# Patient Record
Sex: Female | Born: 1975
Health system: Southern US, Community
[De-identification: ages and names within clinical notes are randomized; demographics above are authoritative.]

## PROBLEM LIST (undated history)

## (undated) DIAGNOSIS — N870 Mild cervical dysplasia: Secondary | ICD-10-CM

## (undated) HISTORY — DX: Mild cervical dysplasia: N87.0

## (undated) HISTORY — PX: TYMPANOSTOMY TUBE PLACEMENT: SHX32

## (undated) HISTORY — PX: WISDOM TOOTH EXTRACTION: SHX21

## (undated) HISTORY — PX: ADENOIDECTOMY: SUR15

---

## 1998-11-09 ENCOUNTER — Other Ambulatory Visit: Admission: RE | Admit: 1998-11-09 | Discharge: 1998-11-09 | Payer: Self-pay | Admitting: *Deleted

## 2000-01-28 ENCOUNTER — Other Ambulatory Visit: Admission: RE | Admit: 2000-01-28 | Discharge: 2000-01-28 | Payer: Self-pay | Admitting: *Deleted

## 2001-03-20 ENCOUNTER — Other Ambulatory Visit: Admission: RE | Admit: 2001-03-20 | Discharge: 2001-03-20 | Payer: Self-pay | Admitting: Gynecology

## 2001-07-04 ENCOUNTER — Emergency Department (HOSPITAL_COMMUNITY): Admission: EM | Admit: 2001-07-04 | Discharge: 2001-07-04 | Payer: Self-pay

## 2002-06-17 ENCOUNTER — Other Ambulatory Visit: Admission: RE | Admit: 2002-06-17 | Discharge: 2002-06-17 | Payer: Self-pay | Admitting: Gynecology

## 2003-09-16 ENCOUNTER — Other Ambulatory Visit: Admission: RE | Admit: 2003-09-16 | Discharge: 2003-09-16 | Payer: Self-pay | Admitting: Gynecology

## 2004-09-14 ENCOUNTER — Other Ambulatory Visit: Admission: RE | Admit: 2004-09-14 | Discharge: 2004-09-14 | Payer: Self-pay | Admitting: Gynecology

## 2005-12-12 ENCOUNTER — Other Ambulatory Visit: Admission: RE | Admit: 2005-12-12 | Discharge: 2005-12-12 | Payer: Self-pay | Admitting: Gynecology

## 2006-05-11 ENCOUNTER — Emergency Department (HOSPITAL_COMMUNITY): Admission: EM | Admit: 2006-05-11 | Discharge: 2006-05-12 | Payer: Self-pay | Admitting: Emergency Medicine

## 2006-12-09 DIAGNOSIS — N870 Mild cervical dysplasia: Secondary | ICD-10-CM

## 2006-12-09 HISTORY — DX: Mild cervical dysplasia: N87.0

## 2007-04-09 HISTORY — PX: CRYOTHERAPY: SHX1416

## 2007-09-24 ENCOUNTER — Other Ambulatory Visit: Admission: RE | Admit: 2007-09-24 | Discharge: 2007-09-24 | Payer: Self-pay | Admitting: Gynecology

## 2008-03-17 ENCOUNTER — Other Ambulatory Visit: Admission: RE | Admit: 2008-03-17 | Discharge: 2008-03-17 | Payer: Self-pay | Admitting: Gynecology

## 2008-09-21 ENCOUNTER — Ambulatory Visit: Payer: Self-pay | Admitting: Women's Health

## 2008-09-21 ENCOUNTER — Encounter: Payer: Self-pay | Admitting: Women's Health

## 2008-09-21 ENCOUNTER — Other Ambulatory Visit: Admission: RE | Admit: 2008-09-21 | Discharge: 2008-09-21 | Payer: Self-pay | Admitting: Gynecology

## 2009-05-29 ENCOUNTER — Encounter: Payer: Self-pay | Admitting: Women's Health

## 2009-05-29 ENCOUNTER — Ambulatory Visit: Payer: Self-pay | Admitting: Women's Health

## 2009-05-29 ENCOUNTER — Other Ambulatory Visit: Admission: RE | Admit: 2009-05-29 | Discharge: 2009-05-29 | Payer: Self-pay | Admitting: Gynecology

## 2010-06-28 ENCOUNTER — Ambulatory Visit: Payer: Self-pay | Admitting: Women's Health

## 2010-06-28 ENCOUNTER — Other Ambulatory Visit: Admission: RE | Admit: 2010-06-28 | Discharge: 2010-06-28 | Payer: Self-pay | Admitting: Gynecology

## 2011-07-11 ENCOUNTER — Other Ambulatory Visit: Payer: Self-pay | Admitting: Women's Health

## 2011-07-17 ENCOUNTER — Ambulatory Visit (INDEPENDENT_AMBULATORY_CARE_PROVIDER_SITE_OTHER): Payer: 59 | Admitting: Women's Health

## 2011-07-17 ENCOUNTER — Encounter: Payer: Self-pay | Admitting: Women's Health

## 2011-07-17 ENCOUNTER — Other Ambulatory Visit (HOSPITAL_COMMUNITY)
Admission: RE | Admit: 2011-07-17 | Discharge: 2011-07-17 | Disposition: A | Discharge status: Home / Self Care | Payer: 59 | Source: Ambulatory Visit | Attending: Women's Health | Admitting: Women's Health

## 2011-07-17 VITALS — BP 120/70 | Ht 65.5 in | Wt 168.0 lb

## 2011-07-17 DIAGNOSIS — Z01419 Encounter for gynecological examination (general) (routine) without abnormal findings: Secondary | ICD-10-CM

## 2011-07-17 DIAGNOSIS — Z309 Encounter for contraceptive management, unspecified: Secondary | ICD-10-CM

## 2011-07-17 MED ORDER — NORGESTIMATE-ETH ESTRADIOL 0.25-35 MG-MCG PO TABS: 1.0000 | ORAL_TABLET | Freq: Every day | ORAL | Status: DC

## 2011-07-17 NOTE — Progress Notes (Signed)
Kristin Esparza 05-Aug-1976 [id number]    History:    The patient presents for annual exam.  35yo SWF G0 monthly 3 days light cycle on Ortho-Cyclen. She has not been sexually active in greater than a year. She is without complaints today. History of CIN-1 with a cryo- in 2008. Normal Paps after. 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   Past medical history, past surgical history, family history and social history were all reviewed and documented in the EPIC chart.   ROS:  A 14 point ROS was performed and pertinent positives and negatives are included in the history.  Exam:  Filed Vitals:   07/17/11 1437  BP: 120/70    General appearance:  Normal Head/Neck:  Normal, without cervical or supraclavicular adenopathy. Thyroid:  Symmetrical, normal in size, without palpable masses or nodularity. Respiratory  Effort:  Normal  Auscultation:  Clear without wheezing or rhonchi Cardiovascular  Auscultation:  Regular rate, without rubs, murmurs or gallops  Edema/varicosities:  Not grossly evident Abdominal  Masses/tenderness:  Soft,nontender, without masses, guarding or rebound.  Liver/spleen:  No organomegaly noted  Hernia:  None appreciated  Occult test:   Skin  Inspection:  Grossly normal  Palpation:  Grossly normal Neurologic/psychiatric  Orientation:  Normal with appropriate conversation.  Mood/affect:  Normal  Genitourinary    Breasts: Examined lying and sitting.     Right: Without masses, retractions, discharge or axillary adenopathy.     Left: Without masses, retractions, discharge or axillary adenopathy.   Inguinal/mons:  Normal without inguinal adenopathy  External genitalia:  Normal  BUS/Urethra/Skene's glands:  Normal  Bladder:  Normal  Vagina:  Normal  Cervix:  Normal  Uterus:   normal in size, shape and contour.  Midline and mobile  Adnexa/parametria:     Rt: Without masses or tenderness.   Lt: Without masses or tenderness.  Anus and perineum: Normal  Digital  rectal exam: Normal sphincter tone without palpated masses or tenderness  Assessment/Plan:  35 y.o. year old female for annual exam.   UA and Pap only today. She had normal labs at her health screening at [facility] health. She states all her labs were within normal limits. She does smoke about a half a pack a day and did review importance of no smoking or squinting for health and did review we could no longer use oral contraceptives if she is still smoking at 36. She will be 36 next year she is aware we can no longer prescribe after that. Prescription proper use of Ortho-Cyclen given, reviewed risks for blood clots strokes. Reviewed IUDs as an alternative contraception. Condoms encouraged if she becomes sexually active. SBE's, exercise, multivitamin daily encouraged.    Harrington Challenger MD, 3:28 PM 07/17/2011

## 2011-08-23 ENCOUNTER — Telehealth: Payer: Self-pay

## 2011-08-23 NOTE — Telephone Encounter (Signed)
Message was left on cell phone.

## 2011-08-23 NOTE — Telephone Encounter (Signed)
Pt called about a prescription for Chantix. She would like to talk to you. Do you want OV or would you call her?

## 2011-08-23 NOTE — Telephone Encounter (Signed)
Sherrilyn Rist I will call her.

## 2011-08-26 NOTE — Telephone Encounter (Signed)
Telephone call to patient left message instructed to call our office

## 2011-08-28 MED ORDER — VARENICLINE TARTRATE 0.5 MG PO TABS: 0.5000 mg | ORAL_TABLET | Freq: Two times a day (BID) | ORAL | Status: AC

## 2011-08-28 NOTE — Telephone Encounter (Signed)
RX CALLED TO JESSICA AT PHARMACY.

## 2011-08-28 NOTE — Telephone Encounter (Signed)
Telephone call to patient, states is ready to start chantix. Will call in Rx 2 [place] pharmacy. Did review chantix at her office visit is aware of, and side effects. Coupon mailed.  Sherri please call in Chantix starter pack  and continuing dose pack to [place] [facility] pharmacy.

## 2012-01-27 ENCOUNTER — Other Ambulatory Visit: Payer: Self-pay | Admitting: *Deleted

## 2012-01-27 ENCOUNTER — Emergency Department (INDEPENDENT_AMBULATORY_CARE_PROVIDER_SITE_OTHER)
Admission: EM | Admit: 2012-01-27 | Discharge: 2012-01-27 | Disposition: A | Discharge status: Home / Self Care | Payer: 59 | Source: Home / Self Care | Attending: Emergency Medicine | Admitting: Emergency Medicine

## 2012-01-27 ENCOUNTER — Encounter (HOSPITAL_COMMUNITY): Payer: Self-pay

## 2012-01-27 DIAGNOSIS — J020 Streptococcal pharyngitis: Secondary | ICD-10-CM

## 2012-01-27 MED ORDER — IBUPROFEN 600 MG PO TABS: 600.0000 mg | ORAL_TABLET | Freq: Four times a day (QID) | ORAL | Status: AC | PRN

## 2012-01-27 MED ORDER — VARENICLINE TARTRATE 1 MG PO TABS: 1.0000 mg | ORAL_TABLET | Freq: Two times a day (BID) | ORAL | Status: AC

## 2012-01-27 MED ORDER — CLINDAMYCIN HCL 150 MG PO CAPS: 450.0000 mg | ORAL_CAPSULE | Freq: Three times a day (TID) | ORAL | Status: AC

## 2012-01-27 NOTE — Discharge Instructions (Signed)
Strep Throat     Strep throat is an infection of the throat caused by a bacteria named Streptococcus pyogenes. Your caregiver may call the infection streptococcal "tonsillitis" or "pharyngitis" depending on whether there are signs of inflammation in the tonsils or back of the throat. Strep throat is most common in children from 5 to 35 years old during the cold months of the year, but it can occur in people of any age during any season. This infection is spread from person to person (contagious) through coughing, sneezing, or other close contact.  SYMPTOMS   · Fever or chills.   · Painful, swollen, red tonsils or throat.   · Pain or difficulty when swallowing.   · White or yellow spots on the tonsils or throat.   · Swollen, tender lymph nodes or "glands" of the neck or under the jaw.   · Red rash all over the body (rare).   DIAGNOSIS   Many different infections can cause the same symptoms. A test must be done to confirm the diagnosis so the right treatment can be given. A "rapid strep test" can help your caregiver make the diagnosis in a few minutes. If this test is not available, a light swab of the infected area can be used for a throat culture test. If a throat culture test is done, results are usually available in a day or two.  TREATMENT   Strep throat is treated with antibiotic medicine.  HOME CARE INSTRUCTIONS   · Gargle with 1 tsp of salt in 1 cup of warm water, 3 to 4 times per day or as needed for comfort.   · Family members who also have a sore throat or fever should be tested for strep throat and treated with antibiotics if they have the strep infection.   · Make sure everyone in your household washes their hands well.   · Do not share food, drinking cups, or personal items that could cause the infection to spread to others.   · You may need to eat a soft food diet until your sore throat gets better.   · Drink enough water and fluids to keep your urine clear or pale yellow. This will help prevent  dehydration.   · Get plenty of rest.   · Stay home from school, daycare, or work until you have been on antibiotics for 24 hours.   · Only take over-the-counter or prescription medicines for pain, discomfort, or fever as directed by your caregiver.   · If antibiotics are prescribed, take them as directed. Finish them even if you start to feel better.   SEEK MEDICAL CARE IF:   · The glands in your neck continue to enlarge.   · You develop a rash, cough, or earache.   · You cough up green, yellow-brown, or bloody sputum.   · You have pain or discomfort not controlled by medicines.   · Your problems seem to be getting worse rather than better.   SEEK IMMEDIATE MEDICAL CARE IF:   · You develop any new symptoms such as vomiting, severe headache, stiff or painful neck, chest pain, shortness of breath, or trouble swallowing.   · You develop severe throat pain, drooling, or changes in your voice.   · You develop swelling of the neck, or the skin on the neck becomes red and tender.   · You have a fever.   · You develop signs of dehydration, such as fatigue, dry mouth, and decreased urination.   · 

## 2012-01-27 NOTE — ED Notes (Signed)
States  She woke this AM w left sided throat pain, now both sides; left tonsil swollen> right side , both slightly reddened, no exudate noted; NAD; c/o generaized body pain, no known exposures

## 2012-01-27 NOTE — ED Provider Notes (Signed)
History     CSN: [id number]  Arrival date & time 01/27/12  1155   First MD Initiated Contact with Patient 01/27/12 1326      Chief Complaint  Patient presents with  . Sore Throat    (Consider location/radiation/quality/duration/timing/severity/associated sxs/prior treatment) HPI Comments: Patient reports left-sided sore throat starting this morning. Reports some malaise, generalized body aches as well. Some rhinorrhea. No nausea, vomiting, ear pain, change in hearing, trismus, drooling, voice changes, reflux symptoms. No headache, coughing, wheezing, abdominal pain, rash. No known sick contacts, but patient is a tach in the OR.. States this feels identical to previous episode of strep throat. Has been taking throat lozenges without relief.  ROS as noted in HPI. All other ROS negative.   Patient is a 36 y.o. female presenting with pharyngitis. The history is provided by the patient. No language interpreter was used.  Sore Throat This is a new problem. The current episode started 6 to 12 hours ago. The problem occurs constantly. The problem has been gradually worsening. Pertinent negatives include no chest pain, no abdominal pain, no headaches and no shortness of breath. The symptoms are aggravated by swallowing. The symptoms are relieved by nothing. She has tried nothing for the symptoms. The treatment provided no relief.    Past Medical History  Diagnosis Date  . CIN I (cervical intraepithelial neoplasia I) 2008    Past Surgical History  Procedure Date  . Tympanostomy tube placement     3 times  . Adenoidectomy   . Cryotherapy 04/2007    Family History  Problem Relation Age of Onset  . Hypertension Mother   . Diabetes Mother   . Ovarian cancer Maternal Aunt   . Cancer Maternal Grandfather     lung cancer    History  Substance Use Topics  . Smoking status: Current Everyday Smoker -- 0.3 packs/day for 10 years    Types: Cigarettes  . Smokeless tobacco: Never Used  .  Alcohol Use: No    OB History    Grav Para Term Preterm Abortions TAB SAB Ect Mult Living   0               Review of Systems  Respiratory: Negative for shortness of breath.   Cardiovascular: Negative for chest pain.  Gastrointestinal: Negative for abdominal pain.  Neurological: Negative for headaches.    Allergies  Penicillins  Home Medications   Current Outpatient Rx  Name Route Sig Dispense Refill  . VARENICLINE TARTRATE 0.5 MG PO TABS Oral Take 0.5 mg by mouth 2 (two) times daily.    Marland Kitchen CLINDAMYCIN HCL 150 MG PO CAPS Oral Take 3 capsules (450 mg total) by mouth 3 (three) times daily. X 10 days 90 capsule 0  . IBUPROFEN 600 MG PO TABS Oral Take 1 tablet (600 mg total) by mouth every 6 (six) hours as needed for pain. 30 tablet 0  . NORGESTIMATE-ETH ESTRADIOL 0.25-35 MG-MCG PO TABS Oral Take 1 tablet by mouth daily. 28 tablet 10    BP 143/89  Pulse 90  Temp(Src) 99.3 F (37.4 C) (Oral)  Resp 16  SpO2 100%  LMP 01/23/2012  Physical Exam  Nursing note and vitals reviewed. Constitutional: She is oriented to person, place, and time. She appears well-developed and well-nourished.  HENT:  Head: Normocephalic and atraumatic.  Right Ear: Tympanic membrane and ear canal normal.  Left Ear: Tympanic membrane and ear canal normal.  Nose: Nose normal.  Mouth/Throat: Uvula is midline and mucous membranes are  normal. Posterior oropharyngeal erythema present. No oropharyngeal exudate.       (-) frontal, maxillary sinus tenderness. Bilateral tonsillar swelling, erythema. left more than right. Scant exudate.  Eyes: Conjunctivae and EOM are normal.  Neck: Normal range of motion. Neck supple.  Cardiovascular: Normal rate, regular rhythm and normal heart sounds.   Pulmonary/Chest: Effort normal and breath sounds normal.  Abdominal: She exhibits no distension.  Musculoskeletal: Normal range of motion.  Lymphadenopathy:    She has cervical adenopathy.  Neurological: She is alert and  oriented to person, place, and time.  Skin: Skin is warm and dry. No rash noted.  Psychiatric: She has a normal mood and affect. Her behavior is normal. Judgment and thought content normal.    ED Course  Procedures (including critical care time)  Labs Reviewed  POCT RAPID STREP A ([facility] CARE ONLY) - Abnormal; Notable for the following:    Streptococcus, Group A Screen (Direct) POSITIVE (*)    All other components within normal limits   No results found.   1. Strep pharyngitis       MDM    Discussed results with patient. Sending him with clindamycin, NSAIDs.   Luiz Blare, MD 01/27/12 1440

## 2012-08-06 ENCOUNTER — Other Ambulatory Visit: Payer: Self-pay | Admitting: Women's Health

## 2012-09-24 ENCOUNTER — Ambulatory Visit (INDEPENDENT_AMBULATORY_CARE_PROVIDER_SITE_OTHER): Payer: 59 | Admitting: Women's Health

## 2012-09-24 ENCOUNTER — Encounter: Payer: Self-pay | Admitting: Women's Health

## 2012-09-24 VITALS — BP 130/82 | Ht 65.5 in | Wt 167.0 lb

## 2012-09-24 DIAGNOSIS — Z87891 Personal history of nicotine dependence: Secondary | ICD-10-CM

## 2012-09-24 DIAGNOSIS — IMO0001 Reserved for inherently not codable concepts without codable children: Secondary | ICD-10-CM

## 2012-09-24 DIAGNOSIS — Z01419 Encounter for gynecological examination (general) (routine) without abnormal findings: Secondary | ICD-10-CM

## 2012-09-24 DIAGNOSIS — Z309 Encounter for contraceptive management, unspecified: Secondary | ICD-10-CM

## 2012-09-24 DIAGNOSIS — N870 Mild cervical dysplasia: Secondary | ICD-10-CM

## 2012-09-24 LAB — CBC WITH DIFFERENTIAL/PLATELET
Basophils Absolute: 0 10*3/uL (ref 0.0–0.1)
Basophils Relative: 0 % (ref 0–1)
Eosinophils Relative: 3 % (ref 0–5)
HCT: 40.2 % (ref 36.0–46.0)
MCHC: 33.6 g/dL (ref 30.0–36.0)
MCV: 90.5 fL (ref 78.0–100.0)
Monocytes Absolute: 0.4 10*3/uL (ref 0.1–1.0)
Monocytes Relative: 5 % (ref 3–12)
RDW: 12.7 % (ref 11.5–15.5)

## 2012-09-24 MED ORDER — VARENICLINE TARTRATE 0.5 MG PO TABS
0.5000 mg | ORAL_TABLET | Freq: Two times a day (BID) | ORAL | Status: DC
Start: 2012-09-24 — End: 2013-12-06

## 2012-09-24 MED ORDER — NORGESTIMATE-ETH ESTRADIOL 0.25-35 MG-MCG PO TABS: 1.0000 | ORAL_TABLET | Freq: Every day | ORAL | Status: DC

## 2012-09-24 NOTE — Patient Instructions (Addendum)

## 2012-09-24 NOTE — Progress Notes (Signed)
Kristin Esparza January 31, 1976 [id number]    History:    The patient presents for annual exam.  Monthly 4 days cycle on Sprintec without complaint. Not sexually active. History of CIN-1 with cryo- in 2008 with normal Paps after. Quit smoking 3 months ago currently on Chantix daily. Has had normal fasting blood sugars at home with mother's meter.   Past medical history, past surgical history, family history and social history were all reviewed and documented in the EPIC chart. 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 Maternal aunt ovarian cancer at age 75.   ROS:  A  ROS was performed and pertinent positives and negatives are included in the history.  Exam:  Filed Vitals:   09/24/12 1419  BP: 130/82    General appearance:  Normal Head/Neck:  Normal, without cervical or supraclavicular adenopathy. Thyroid:  Symmetrical, normal in size, without palpable masses or nodularity. Respiratory  Effort:  Normal  Auscultation:  Clear without wheezing or rhonchi Cardiovascular  Auscultation:  Regular rate, without rubs, murmurs or gallops  Edema/varicosities:  Not grossly evident Abdominal  Soft,nontender, without masses, guarding or rebound.  Liver/spleen:  No organomegaly noted  Hernia:  None appreciated  Skin  Inspection:  Grossly normal  Palpation:  Grossly normal Neurologic/psychiatric  Orientation:  Normal with appropriate conversation.  Mood/affect:  Normal  Genitourinary    Breasts: Examined lying and sitting.     Right: Without masses, retractions, discharge or axillary adenopathy.     Left: Without masses, retractions, discharge or axillary adenopathy.   Inguinal/mons:  Normal without inguinal adenopathy  External genitalia:  Normal  BUS/Urethra/Skene's glands:  Normal  Bladder:  Normal  Vagina:  Normal  Cervix:  Normal  Uterus:   normal in size, shape and contour.  Midline and mobile  Adnexa/parametria:     Rt: Without masses or  tenderness.   Lt: Without masses or tenderness.  Anus and perineum: Normal  Digital rectal exam: Normal sphincter tone without palpated masses or tenderness  Assessment/Plan:  36 y.o. SWF G0  for annual exam with no complaints.  Normal GYN exam on Sprintec Former smoker  Plan: Congratulated on smoking cessation, will continue on Chantix 50 daily prescription, proper use given and reviewed for one more month to help prevent relapse. Reviewed marked increased risk for blood clots and strokes if smoking and on birth control pills after 36. Sprintec prescription, proper use given and reviewed, condoms encouraged if become sexually active. SBE's,  calcium rich diet, decreasing calories and increasing exercise for weight loss and MVI daily encouraged. CBC, UA, no Pap, history of normal Pap in 2012. New screening guidelines reviewed.    Harrington Challenger [facility], 3:27 PM 09/24/2012

## 2012-09-25 LAB — URINALYSIS W MICROSCOPIC + REFLEX CULTURE
Bilirubin Urine: NEGATIVE
Crystals: NONE SEEN
Leukocytes, UA: NEGATIVE
Nitrite: NEGATIVE
Protein, ur: NEGATIVE mg/dL
Specific Gravity, Urine: 1.02 (ref 1.005–1.030)
Squamous Epithelial / LPF: NONE SEEN
Urobilinogen, UA: 0.2 mg/dL (ref 0.0–1.0)

## 2012-12-21 ENCOUNTER — Other Ambulatory Visit (HOSPITAL_COMMUNITY): Payer: Self-pay | Admitting: Family Medicine

## 2012-12-21 DIAGNOSIS — J329 Chronic sinusitis, unspecified: Secondary | ICD-10-CM

## 2012-12-28 ENCOUNTER — Ambulatory Visit (HOSPITAL_COMMUNITY)
Admission: RE | Admit: 2012-12-28 | Discharge: 2012-12-28 | Disposition: A | Discharge status: Home / Self Care | Payer: 59 | Source: Ambulatory Visit | Attending: Family Medicine | Admitting: Family Medicine

## 2012-12-28 DIAGNOSIS — J329 Chronic sinusitis, unspecified: Secondary | ICD-10-CM

## 2013-09-24 ENCOUNTER — Emergency Department (HOSPITAL_COMMUNITY)
Admission: EM | Admit: 2013-09-24 | Discharge: 2013-09-24 | Disposition: A | Discharge status: Home / Self Care | Payer: 59 | Source: Home / Self Care | Attending: Family Medicine | Admitting: Family Medicine

## 2013-09-24 ENCOUNTER — Encounter (HOSPITAL_COMMUNITY): Payer: Self-pay | Admitting: Emergency Medicine

## 2013-09-24 DIAGNOSIS — J019 Acute sinusitis, unspecified: Secondary | ICD-10-CM

## 2013-09-24 DIAGNOSIS — J069 Acute upper respiratory infection, unspecified: Secondary | ICD-10-CM

## 2013-09-24 MED ORDER — AZITHROMYCIN 250 MG PO TABS: ORAL_TABLET | ORAL | Status: DC

## 2013-09-24 MED ORDER — HYDROCOD POLST-CHLORPHEN POLST 10-8 MG/5ML PO LQCR: 5.0000 mL | Freq: Every evening | ORAL | Status: DC | PRN

## 2013-09-24 NOTE — ED Provider Notes (Signed)
CSN: [id number]     Arrival date & time 09/24/13  1027 History   First MD Initiated Contact with Patient 09/24/13 1048     Chief Complaint  Patient presents with  . URI    Patient is a 37 y.o. female presenting with URI. The history is provided by the patient.  URI Presenting symptoms: congestion, cough, ear pain, facial pain, fatigue, fever, rhinorrhea and sore throat   Severity:  Moderate Onset quality:  Gradual Duration:  3 days Timing:  Constant Progression:  Worsening Chronicity:  New Relieved by:  Nothing Ineffective treatments:  Decongestant and OTC medications Associated symptoms: sinus pain and sneezing   Associated symptoms: no swollen glands and no wheezing   Risk factors: no recent illness, no recent travel and no sick contacts   Pt reports onset of sinus congestion on Wednesday after several days of worsening seasonal allergy type symptoms that were not relieved by OTC anti-allergy med. Symptoms have worsened and now include bil ear pressure, throat "irritation", congested cough and bil facial pressure. Cough is worse at night. Now pt reports worsening malaise and subjective fever.  Past Medical History  Diagnosis Date  . CIN I (cervical intraepithelial neoplasia I) 2008   Past Surgical History  Procedure Laterality Date  . Tympanostomy tube placement      3 times  . Adenoidectomy    . Cryotherapy  04/2007   Family History  Problem Relation Age of Onset  . Hypertension Mother   . Diabetes Mother   . Ovarian cancer Maternal Aunt   . Cancer Maternal Grandfather     lung cancer   History  Substance Use Topics  . Smoking status: Former Smoker -- 0.30 packs/day for 10 years    Types: Cigarettes  . Smokeless tobacco: Never Used  . Alcohol Use: No   OB History   Grav Para Term Preterm Abortions TAB SAB Ect Mult Living   0              Review of Systems  Constitutional: Positive for fever, chills and fatigue.  HENT: Positive for congestion, ear pain,  rhinorrhea, sinus pressure, sneezing and sore throat. Negative for facial swelling and trouble swallowing.   Respiratory: Positive for cough. Negative for wheezing.   Cardiovascular: Negative.   Gastrointestinal: Negative.   Endocrine: Negative.   Genitourinary: Negative.   Musculoskeletal: Negative.   Allergic/Immunologic: Negative.   Neurological: Negative.   Hematological: Negative.   Psychiatric/Behavioral: Negative.     Allergies  Penicillins  Home Medications   Current Outpatient Rx  Name  Route  Sig  Dispense  Refill  . azithromycin (ZITHROMAX Z-PAK) 250 MG tablet      Take 2 tabs on day 1 then 1 tab on days 2-5   6 tablet   0   . chlorpheniramine-HYDROcodone (TUSSIONEX PENNKINETIC ER) 10-8 MG/5ML LQCR   Oral   Take 5 mLs by mouth at bedtime as needed.   30 mL   0   . norgestimate-ethinyl estradiol (MONONESSA) 0.25-35 MG-MCG tablet   Oral   Take 1 tablet by mouth daily.   84 tablet   4   . varenicline (CHANTIX) 0.5 MG tablet   Oral   Take 1 tablet (0.5 mg total) by mouth 2 (two) times daily.   60 tablet   2    BP 143/88  Pulse 101  Temp(Src) 99.4 F (37.4 C) (Oral)  SpO2 98%  LMP 09/23/2013 Physical Exam  Constitutional: She appears well-developed and well-nourished. She  has a sickly appearance.  HENT:  Head: Normocephalic and atraumatic.  Right Ear: Tympanic membrane, external ear and ear canal normal.  Left Ear: Tympanic membrane, external ear and ear canal normal.  Nose: Mucosal edema present. Right sinus exhibits maxillary sinus tenderness. Right sinus exhibits no frontal sinus tenderness. Left sinus exhibits maxillary sinus tenderness. Left sinus exhibits no frontal sinus tenderness.  Mouth/Throat: Uvula is midline, oropharynx is clear and moist and mucous membranes are normal.  Neck: Neck supple.  Cardiovascular: Normal rate and regular rhythm.   Pulmonary/Chest: Effort normal and breath sounds normal.  Musculoskeletal: Normal range of  motion.  Lymphadenopathy:    She has no cervical adenopathy.  Neurological: She is alert.  Skin: Skin is warm and dry.  Psychiatric: She has a normal mood and affect.    ED Course  Procedures (including critical care time) Labs Review Labs Reviewed - No data to display Imaging Review No results found.  EKG Interpretation     Ventricular Rate:    PR Interval:    QRS Duration:   QT Interval:    QTC Calculation:   R Axis:     Text Interpretation:              MDM   1. Acute sinusitis   2. URI (upper respiratory infection)    3 day h/o sinus congestion and pressure that has worsened and now involves bil ear pressure and congested cough w/ subjective fever. PE c/w acute sinusititis. Will treat w/ Z-Pack as allergic to PCN. Will also provide short course of med for cough at night. Pt to f/u w/ PCP if not improving. Pt agreeable w/ plan.    Leanne Chang, NP 09/24/13 [phone]

## 2013-09-24 NOTE — ED Notes (Signed)
Pt  Reports    Symptoms  Of  Sinus  Congestion  /  Pressure       Stuffy  Nose  And  Has  Some  Productive  Cough     Pt  Reports  Beginning  To  Have  Some  Pressure  And  Itching in  Ears   She  Is  Sitting  Upright on exam table  Speaking in  Complete  sentances  And  Is  In no acute  distress

## 2013-09-25 NOTE — ED Provider Notes (Signed)
Medical screening examination/treatment/procedure(s) were performed by a resident physician or non-physician practitioner and as the supervising physician I was immediately available for consultation/collaboration.  Clementeen Graham, MD  Rodolph Bong, MD 09/25/13 [phone]

## 2013-12-06 ENCOUNTER — Other Ambulatory Visit: Payer: Self-pay | Admitting: Women's Health

## 2013-12-20 ENCOUNTER — Other Ambulatory Visit: Payer: Self-pay | Admitting: Women's Health

## 2013-12-24 ENCOUNTER — Encounter: Payer: Self-pay | Admitting: Women's Health

## 2014-01-07 ENCOUNTER — Other Ambulatory Visit (HOSPITAL_COMMUNITY)
Admission: RE | Admit: 2014-01-07 | Discharge: 2014-01-07 | Disposition: A | Discharge status: Home / Self Care | Payer: 59 | Source: Ambulatory Visit | Attending: Gynecology | Admitting: Gynecology

## 2014-01-07 ENCOUNTER — Encounter: Payer: Self-pay | Admitting: Women's Health

## 2014-01-07 ENCOUNTER — Ambulatory Visit (INDEPENDENT_AMBULATORY_CARE_PROVIDER_SITE_OTHER): Payer: 59 | Admitting: Women's Health

## 2014-01-07 VITALS — BP 116/72 | Ht 65.5 in | Wt 162.4 lb

## 2014-01-07 DIAGNOSIS — Z01419 Encounter for gynecological examination (general) (routine) without abnormal findings: Secondary | ICD-10-CM

## 2014-01-07 DIAGNOSIS — IMO0001 Reserved for inherently not codable concepts without codable children: Secondary | ICD-10-CM

## 2014-01-07 DIAGNOSIS — Z833 Family history of diabetes mellitus: Secondary | ICD-10-CM

## 2014-01-07 DIAGNOSIS — Z309 Encounter for contraceptive management, unspecified: Secondary | ICD-10-CM

## 2014-01-07 DIAGNOSIS — Z13220 Encounter for screening for lipoid disorders: Secondary | ICD-10-CM

## 2014-01-07 LAB — CBC WITH DIFFERENTIAL/PLATELET
Basophils Absolute: 0 K/uL (ref 0.0–0.1)
Basophils Relative: 0 % (ref 0–1)
Eosinophils Absolute: 0.2 K/uL (ref 0.0–0.7)
Eosinophils Relative: 3 % (ref 0–5)
HCT: 40.7 % (ref 36.0–46.0)
Hemoglobin: 13.6 g/dL (ref 12.0–15.0)
Lymphocytes Relative: 30 % (ref 12–46)
Lymphs Abs: 1.9 K/uL (ref 0.7–4.0)
MCH: 30.1 pg (ref 26.0–34.0)
MCHC: 33.4 g/dL (ref 30.0–36.0)
MCV: 90 fL (ref 78.0–100.0)
Monocytes Absolute: 0.4 K/uL (ref 0.1–1.0)
Monocytes Relative: 7 % (ref 3–12)
Neutro Abs: 3.7 K/uL (ref 1.7–7.7)
Neutrophils Relative %: 60 % (ref 43–77)
Platelets: 244 K/uL (ref 150–400)
RBC: 4.52 MIL/uL (ref 3.87–5.11)
RDW: 13.1 % (ref 11.5–15.5)
WBC: 6.2 K/uL (ref 4.0–10.5)

## 2014-01-07 MED ORDER — NORGESTIMATE-ETH ESTRADIOL 0.25-35 MG-MCG PO TABS: ORAL_TABLET | ORAL | Status: DC

## 2014-01-07 NOTE — Patient Instructions (Signed)

## 2014-01-07 NOTE — Addendum Note (Signed)
Addended by: WILKINSON, KARI S on: 01/07/2014 05:02 PM   Modules accepted: Orders  

## 2014-01-07 NOTE — Progress Notes (Signed)
Kristin RutherfordJaime L Esparza 10/25/1976 [id number]    History:    Presents for annual exam.  Light monthly cycle on Sprintec without complaint. Not sexually active. 2008 CIN-1 cryo with normal Paps after.   Past medical history, past surgical history, family history and social history were all reviewed and documented in the EPIC chart. 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 Maternal aunt ovarian cancer.  ROS:  A  ROS was performed and pertinent positives and negatives are included.  Exam:  Filed Vitals:   01/07/14 1440  BP: 116/72    General appearance:  Normal Thyroid:  Symmetrical, normal in size, without palpable masses or nodularity. Respiratory  Auscultation:  Clear without wheezing or rhonchi Cardiovascular  Auscultation:  Regular rate, without rubs, murmurs or gallops  Edema/varicosities:  Not grossly evident Abdominal  Soft,nontender, without masses, guarding or rebound.  Liver/spleen:  No organomegaly noted  Hernia:  None appreciated  Skin  Inspection:  Grossly normal   Breasts: Examined lying and sitting.     Right: Without masses, retractions, discharge or axillary adenopathy.     Left: Without masses, retractions, discharge or axillary adenopathy. Gentitourinary   Inguinal/mons:  Normal without inguinal adenopathy  External genitalia:  Normal  BUS/Urethra/Skene's glands:  Normal  Vagina:  Normal  Cervix:  Normal  Uterus:   normal in size, shape and contour.  Midline and mobile  Adnexa/parametria:     Rt: Without masses or tenderness.   Lt: Without masses or tenderness.  Anus and perineum: Normal  Digital rectal exam: Normal sphincter tone without palpated masses or tenderness  Assessment/Plan:  38 y.o. SWF G0 for annual exam with no complaints.  2008 CIN-1/cryo- normal Paps after  Plan: Sprintec prescription, proper use, risk for blood clots and strokes reviewed, condoms encouraged if sexually active. SBE's, increase regular  exercise and decrease calories for weight loss, calcium rich diet, MVI daily encouraged. Not planning on children at this time. CBC, glucose, lipid panel, UA, Pap. Pap normal 07/2011.    Harrington ChallengerYOUNG,Panayiotis Rainville J Sar[facility] Phyiscians Surgical CenterWHNP, 4:50 PM 01/07/2014

## 2014-01-08 LAB — LIPID PANEL
CHOL/HDL RATIO: 2.8 ratio
CHOLESTEROL: 182 mg/dL (ref 0–200)
HDL: 66 mg/dL (ref >39)
LDL Cholesterol: 84 mg/dL (ref 0–99)
Triglycerides: 161 mg/dL — ABNORMAL HIGH (ref <150)
VLDL: 32 mg/dL (ref 0–40)

## 2014-01-08 LAB — URINALYSIS W MICROSCOPIC + REFLEX CULTURE
BILIRUBIN URINE: NEGATIVE
Casts: NONE SEEN
Crystals: NONE SEEN
Glucose, UA: NEGATIVE mg/dL
Hgb urine dipstick: NEGATIVE
KETONES UR: NEGATIVE mg/dL
Leukocytes, UA: NEGATIVE
Nitrite: NEGATIVE
Protein, ur: NEGATIVE mg/dL
Specific Gravity, Urine: 1.009 (ref 1.005–1.030)
Urobilinogen, UA: 0.2 mg/dL (ref 0.0–1.0)
pH: 6.5 (ref 5.0–8.0)

## 2014-01-08 LAB — GLUCOSE, RANDOM: Glucose, Bld: 89 mg/dL (ref 70–99)

## 2014-01-09 LAB — URINE CULTURE

## 2015-02-13 ENCOUNTER — Other Ambulatory Visit: Payer: Self-pay | Admitting: Women's Health

## 2015-02-13 NOTE — Telephone Encounter (Signed)
Lat CE 01/07/2014. She has CE scheduled 02/23/15 with you.

## 2015-02-23 ENCOUNTER — Encounter: Payer: 59 | Admitting: Women's Health

## 2015-03-23 ENCOUNTER — Encounter: Payer: Self-pay | Admitting: Women's Health

## 2015-03-23 ENCOUNTER — Other Ambulatory Visit (HOSPITAL_COMMUNITY)
Admission: RE | Admit: 2015-03-23 | Discharge: 2015-03-23 | Disposition: A | Discharge status: Home / Self Care | Payer: 59 | Source: Ambulatory Visit | Attending: Women's Health | Admitting: Women's Health

## 2015-03-23 ENCOUNTER — Ambulatory Visit (INDEPENDENT_AMBULATORY_CARE_PROVIDER_SITE_OTHER): Payer: 59 | Admitting: Women's Health

## 2015-03-23 VITALS — BP 117/72 | Ht 65.0 in | Wt 155.8 lb

## 2015-03-23 DIAGNOSIS — J0100 Acute maxillary sinusitis, unspecified: Secondary | ICD-10-CM | POA: Diagnosis not present

## 2015-03-23 DIAGNOSIS — Z833 Family history of diabetes mellitus: Secondary | ICD-10-CM | POA: Diagnosis not present

## 2015-03-23 DIAGNOSIS — Z3041 Encounter for surveillance of contraceptive pills: Secondary | ICD-10-CM

## 2015-03-23 DIAGNOSIS — Z01419 Encounter for gynecological examination (general) (routine) without abnormal findings: Secondary | ICD-10-CM | POA: Insufficient documentation

## 2015-03-23 DIAGNOSIS — B373 Candidiasis of vulva and vagina: Secondary | ICD-10-CM

## 2015-03-23 DIAGNOSIS — Z13220 Encounter for screening for lipoid disorders: Secondary | ICD-10-CM

## 2015-03-23 DIAGNOSIS — Z1151 Encounter for screening for human papillomavirus (HPV): Secondary | ICD-10-CM | POA: Insufficient documentation

## 2015-03-23 DIAGNOSIS — B3731 Acute candidiasis of vulva and vagina: Secondary | ICD-10-CM

## 2015-03-23 LAB — LIPID PANEL
Cholesterol: 197 mg/dL (ref 0–200)
HDL: 67 mg/dL (ref >=46)
LDL Cholesterol: 104 mg/dL — ABNORMAL HIGH (ref 0–99)
Total CHOL/HDL Ratio: 2.9 Ratio
Triglycerides: 130 mg/dL (ref <150)
VLDL: 26 mg/dL (ref 0–40)

## 2015-03-23 LAB — GLUCOSE, RANDOM: Glucose, Bld: 91 mg/dL (ref 70–99)

## 2015-03-23 MED ORDER — CLINDAMYCIN HCL 150 MG PO CAPS: 150.0000 mg | ORAL_CAPSULE | Freq: Three times a day (TID) | ORAL | Status: DC

## 2015-03-23 MED ORDER — NORGESTIMATE-ETH ESTRADIOL 0.25-35 MG-MCG PO TABS: 1.0000 | ORAL_TABLET | Freq: Every day | ORAL | Status: DC

## 2015-03-23 MED ORDER — FLUCONAZOLE 150 MG PO TABS: 150.0000 mg | ORAL_TABLET | Freq: Once | ORAL | Status: DC

## 2015-03-23 NOTE — Patient Instructions (Signed)
Health Maintenance Adopting a healthy lifestyle and getting preventive care can go a long way to promote health and wellness. Talk with your health care provider about what schedule of regular examinations is right for you. This is a good chance for you to check in with your provider about disease prevention and staying healthy. In between checkups, there are plenty of things you can do on your own. Experts have done a lot of research about which lifestyle changes and preventive measures are most likely to keep you healthy. Ask your health care provider for more information. WEIGHT AND DIET  Eat a healthy diet  Be sure to include plenty of vegetables, fruits, low-fat dairy products, and lean protein.  Do not eat a lot of foods high in solid fats, added sugars, or salt.  Get regular exercise. This is one of the most important things you can do for your health.  Most adults should exercise for at least 150 minutes each week. The exercise should increase your heart rate and make you sweat (moderate-intensity exercise).  Most adults should also do strengthening exercises at least twice a week. This is in addition to the moderate-intensity exercise.  Maintain a healthy weight  Body mass index (BMI) is a measurement that can be used to identify possible weight problems. It estimates body fat based on height and weight. Your health care provider can help determine your BMI and help you achieve or maintain a healthy weight.  For females 25 years of age and older:   A BMI below 18.5 is considered underweight.  A BMI of 18.5 to 24.9 is normal.  A BMI of 25 to 29.9 is considered overweight.  A BMI of 30 and above is considered obese.  Watch levels of cholesterol and blood lipids  You should start having your blood tested for lipids and cholesterol at 38 years of age, then have this test every 5 years.  You may need to have your cholesterol levels checked more often if:  Your lipid or  cholesterol levels are high.  You are older than 38 years of age.  You are at high risk for heart disease.  CANCER SCREENING   Lung Cancer  Lung cancer screening is recommended for adults 97-92 years old who are at high risk for lung cancer because of a history of smoking.  A yearly low-dose CT scan of the lungs is recommended for people who:  Currently smoke.  Have quit within the past 15 years.  Have at least a 30-pack-year history of smoking. A pack year is smoking an average of one pack of cigarettes a day for 1 year.  Yearly screening should continue until it has been 15 years since you quit.  Yearly screening should stop if you develop a health problem that would prevent you from having lung cancer treatment.  Breast Cancer  Practice breast self-awareness. This means understanding how your breasts normally appear and feel.  It also means doing regular breast self-exams. Let your health care provider know about any changes, no matter how small.  If you are in your 20s or 30s, you should have a clinical breast exam (CBE) by a health care provider every 1-3 years as part of a regular health exam.  If you are 76 or older, have a CBE every year. Also consider having a breast X-ray (mammogram) every year.  If you have a family history of breast cancer, talk to your health care provider about genetic screening.  If you are  at high risk for breast cancer, talk to your health care provider about having an MRI and a mammogram every year.  Breast cancer gene (BRCA) assessment is recommended for women who have family members with BRCA-related cancers. BRCA-related cancers include:  Breast.  Ovarian.  Tubal.  Peritoneal cancers.  Results of the assessment will determine the need for genetic counseling and BRCA1 and BRCA2 testing. Cervical Cancer Routine pelvic examinations to screen for cervical cancer are no longer recommended for nonpregnant women who are considered low  risk for cancer of the pelvic organs (ovaries, uterus, and vagina) and who do not have symptoms. A pelvic examination may be necessary if you have symptoms including those associated with pelvic infections. Ask your health care provider if a screening pelvic exam is right for you.   The Pap test is the screening test for cervical cancer for women who are considered at risk.  If you had a hysterectomy for a problem that was not cancer or a condition that could lead to cancer, then you no longer need Pap tests.  If you are older than 65 years, and you have had normal Pap tests for the past 10 years, you no longer need to have Pap tests.  If you have had past treatment for cervical cancer or a condition that could lead to cancer, you need Pap tests and screening for cancer for at least 20 years after your treatment.  If you no longer get a Pap test, assess your risk factors if they change (such as having a new sexual partner). This can affect whether you should start being screened again.  Some women have medical problems that increase their chance of getting cervical cancer. If this is the case for you, your health care provider may recommend more frequent screening and Pap tests.  The human papillomavirus (HPV) test is another test that may be used for cervical cancer screening. The HPV test looks for the virus that can cause cell changes in the cervix. The cells collected during the Pap test can be tested for HPV.  The HPV test can be used to screen women 30 years of age and older. Getting tested for HPV can extend the interval between normal Pap tests from three to five years.  An HPV test also should be used to screen women of any age who have unclear Pap test results.  After 38 years of age, women should have HPV testing as often as Pap tests.  Colorectal Cancer  This type of cancer can be detected and often prevented.  Routine colorectal cancer screening usually begins at 38 years of  age and continues through 38 years of age.  Your health care provider may recommend screening at an earlier age if you have risk factors for colon cancer.  Your health care provider may also recommend using home test kits to check for hidden blood in the stool.  A small camera at the end of a tube can be used to examine your colon directly (sigmoidoscopy or colonoscopy). This is done to check for the earliest forms of colorectal cancer.  Routine screening usually begins at age 50.  Direct examination of the colon should be repeated every 5-10 years through 38 years of age. However, you may need to be screened more often if early forms of precancerous polyps or small growths are found. Skin Cancer  Check your skin from head to toe regularly.  Tell your health care provider about any new moles or changes in   moles, especially if there is a change in a mole's shape or color.  Also tell your health care provider if you have a mole that is larger than the size of a pencil eraser.  Always use sunscreen. Apply sunscreen liberally and repeatedly throughout the day.  Protect yourself by wearing long sleeves, pants, a wide-brimmed hat, and sunglasses whenever you are outside. HEART DISEASE, DIABETES, AND HIGH BLOOD PRESSURE   Have your blood pressure checked at least every 1-2 years. High blood pressure causes heart disease and increases the risk of stroke.  If you are between 75 years and 42 years old, ask your health care provider if you should take aspirin to prevent strokes.  Have regular diabetes screenings. This involves taking a blood sample to check your fasting blood sugar level.  If you are at a normal weight and have a low risk for diabetes, have this test once every three years after 38 years of age.  If you are overweight and have a high risk for diabetes, consider being tested at a younger age or more often. PREVENTING INFECTION  Hepatitis B  If you have a higher risk for  hepatitis B, you should be screened for this virus. You are considered at high risk for hepatitis B if:  You were born in a country where hepatitis B is common. Ask your health care provider which countries are considered high risk.  Your parents were born in a high-risk country, and you have not been immunized against hepatitis B (hepatitis B vaccine).  You have HIV or AIDS.  You use needles to inject street drugs.  You live with someone who has hepatitis B.  You have had sex with someone who has hepatitis B.  You get hemodialysis treatment.  You take certain medicines for conditions, including cancer, organ transplantation, and autoimmune conditions. Hepatitis C  Blood testing is recommended for:  Everyone born from [birth year] through 1964.  Anyone with known risk factors for hepatitis C. Sexually transmitted infections (STIs)  You should be screened for sexually transmitted infections (STIs) including gonorrhea and chlamydia if:  You are sexually active and are younger than 38 years of age.  You are older than 38 years of age and your health care provider tells you that you are at risk for this type of infection.  Your sexual activity has changed since you were last screened and you are at an increased risk for chlamydia or gonorrhea. Ask your health care provider if you are at risk.  If you do not have HIV, but are at risk, it may be recommended that you take a prescription medicine daily to prevent HIV infection. This is called pre-exposure prophylaxis (PrEP). You are considered at risk if:  You are sexually active and do not regularly use condoms or know the HIV status of your partner(s).  You take drugs by injection.  You are sexually active with a partner who has HIV. Talk with your health care provider about whether you are at high risk of being infected with HIV. If you choose to begin PrEP, you should first be tested for HIV. You should then be tested every 3 months for  as long as you are taking PrEP.  PREGNANCY   If you are premenopausal and you may become pregnant, ask your health care provider about preconception counseling.  If you may become pregnant, take 400 to 800 micrograms (mcg) of folic acid every day.  If you want to prevent pregnancy, talk to your  health care provider about birth control (contraception). OSTEOPOROSIS AND MENOPAUSE   Osteoporosis is a disease in which the bones lose minerals and strength with aging. This can result in serious bone fractures. Your risk for osteoporosis can be identified using a bone density scan.  If you are 65 years of age or older, or if you are at risk for osteoporosis and fractures, ask your health care provider if you should be screened.  Ask your health care provider whether you should take a calcium or vitamin D supplement to lower your risk for osteoporosis.  Menopause may have certain physical symptoms and risks.  Hormone replacement therapy may reduce some of these symptoms and risks. Talk to your health care provider about whether hormone replacement therapy is right for you.  HOME CARE INSTRUCTIONS   Schedule regular health, dental, and eye exams.  Stay current with your immunizations.   Do not use any tobacco products including cigarettes, chewing tobacco, or electronic cigarettes.  If you are pregnant, do not drink alcohol.  If you are breastfeeding, limit how much and how often you drink alcohol.  Limit alcohol intake to no more than 1 drink per day for nonpregnant women. One drink equals 12 ounces of beer, 5 ounces of wine, or 1 ounces of hard liquor.  Do not use street drugs.  Do not share needles.  Ask your health care provider for help if you need support or information about quitting drugs.  Tell your health care provider if you often feel depressed.  Tell your health care provider if you have ever been abused or do not feel safe at home. Document Released: 06/10/2011  Document Revised: 04/11/2014 Document Reviewed: 10/27/2013 ExitCare Patient Information 2015 [employer]. This information is not intended to replace advice given to you by your health care provider. Make sure you discuss any questions you have with your health care provider. Exercise to Stay Healthy Exercise helps you become and stay healthy. EXERCISE IDEAS AND TIPS Choose exercises that:  You enjoy.  Fit into your day. You do not need to exercise really hard to be healthy. You can do exercises at a slow or medium level and stay healthy. You can:  Stretch before and after working out.  Try yoga, Pilates, or tai chi.  Lift weights.  Walk fast, swim, jog, run, climb stairs, bicycle, dance, or rollerskate.  Take aerobic classes. Exercises that burn about 150 calories:  Running 1  miles in 15 minutes.  Playing volleyball for 45 to 60 minutes.  Washing and waxing a car for 45 to 60 minutes.  Playing touch football for 45 minutes.  Walking 1  miles in 35 minutes.  Pushing a stroller 1  miles in 30 minutes.  Playing basketball for 30 minutes.  Raking leaves for 30 minutes.  Bicycling 5 miles in 30 minutes.  Walking 2 miles in 30 minutes.  Dancing for 30 minutes.  Shoveling snow for 15 minutes.  Swimming laps for 20 minutes.  Walking up stairs for 15 minutes.  Bicycling 4 miles in 15 minutes.  Gardening for 30 to 45 minutes.  Jumping rope for 15 minutes.  Washing windows or floors for 45 to 60 minutes. Document Released: 12/28/2010 Document Revised: 02/17/2012 Document Reviewed: 12/28/2010 Exit[employer] Patient Information 2015 [employer]. This information is not intended to replace advice given to you by your health care provider. Make sure you discuss any questions you have with your health care provider.  

## 2015-03-23 NOTE — Progress Notes (Signed)
Kristin Esparza August 22, 1976 [id number]    History:    Presents for annual exam. Regular monthly cycle/Sprintec, requests refills. LMP: 03/15/15, not sexually active. 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 Quit smoking 2013. Has been taking Protonix as needed for gastritis. Also requesting prescription for clindamycin for acute maxillary sinusitis, unable to get in with PCP, has had in the past. No relief with Sudafed and currently on Zyrtec daily.  Past medical history, past surgical history, family history and social history were all reviewed and documented in the EPIC chart. 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 Mother diabetes and hypertension. Maternal aunt ovarian cancer. Dating, not sexually active.  ROS:  A ROS was performed and pertinent positives and negatives are included.  Exam:  Filed Vitals:   03/23/15 1556  BP: 117/72    General appearance:  Normal Thyroid:  Symmetrical, normal in size, without palpable masses or nodularity. Respiratory  Auscultation:  Clear without wheezing or rhonchi Cardiovascular  Auscultation:  Regular rate, without rubs, murmurs or gallops  Edema/varicosities:  Not grossly evident Abdominal  Soft,nontender, without masses, guarding or rebound.  Liver/spleen:  No organomegaly noted  Hernia:  None appreciated  Skin  Inspection:  Grossly normal   Breasts: Examined lying and sitting. Dense breast tissue bilaterally.    Right: Without masses, retractions, discharge or axillary adenopathy.     Left: Without masses, retractions, discharge or axillary adenopathy. Gentitourinary   Inguinal/mons:  Normal without inguinal adenopathy  External genitalia:  Normal  BUS/Urethra/Skene's glands:  Normal  Vagina:  Normal  Cervix:  Normal  Uterus: Normal in size, shape and contour.  Midline and mobile  Adnexa/parametria:     Rt: Without masses or tenderness.   Lt: Without masses or tenderness.  Anus and perineum: Normal  Digital rectal  exam: Normal sphincter tone without palpated masses or tenderness  Assessment/Plan:  39 y.o. SWF G0 for annual exam.   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 SBE's, vitamin D 1000 daily, folic acid daily, calcium rich diet, regular exercise encouraged. UA, CBC, glucose, lipid panel, Pap with HR HPV typing, new screening guidelines reviewed. Condoms encouraged if sexually active. Prescription for clindamycin 150 mg TID x 7 days and Diflucan 150 mg 1x.   Harrington ChallengerYOUNG,Arrian Manson J [facility]NP, 4:46 PM 03/23/2015

## 2015-03-24 LAB — CBC WITH DIFFERENTIAL/PLATELET
BASOS PCT: 0 % (ref 0–1)
Basophils Absolute: 0 10*3/uL (ref 0.0–0.1)
EOS ABS: 0.1 10*3/uL (ref 0.0–0.7)
EOS PCT: 1 % (ref 0–5)
HCT: 38.9 % (ref 36.0–46.0)
HEMOGLOBIN: 12.8 g/dL (ref 12.0–15.0)
Lymphocytes Relative: 29 % (ref 12–46)
Lymphs Abs: 2 10*3/uL (ref 0.7–4.0)
MCH: 29.7 pg (ref 26.0–34.0)
MCHC: 32.9 g/dL (ref 30.0–36.0)
MCV: 90.3 fL (ref 78.0–100.0)
MPV: 10 fL (ref 8.6–12.4)
Monocytes Absolute: 0.4 10*3/uL (ref 0.1–1.0)
Monocytes Relative: 6 % (ref 3–12)
Neutro Abs: 4.4 10*3/uL (ref 1.7–7.7)
Neutrophils Relative %: 64 % (ref 43–77)
Platelets: 226 10*3/uL (ref 150–400)
RBC: 4.31 MIL/uL (ref 3.87–5.11)
RDW: 12.3 % (ref 11.5–15.5)
WBC: 6.8 10*3/uL (ref 4.0–10.5)

## 2015-03-24 LAB — URINALYSIS W MICROSCOPIC + REFLEX CULTURE
BACTERIA UA: NONE SEEN
Bilirubin Urine: NEGATIVE
CRYSTALS: NONE SEEN
Casts: NONE SEEN
Glucose, UA: NEGATIVE mg/dL
HGB URINE DIPSTICK: NEGATIVE
Ketones, ur: NEGATIVE mg/dL
Leukocytes, UA: NEGATIVE
Nitrite: NEGATIVE
PH: 6.5 (ref 5.0–8.0)
PROTEIN: NEGATIVE mg/dL
SPECIFIC GRAVITY, URINE: 1.022 (ref 1.005–1.030)
Urobilinogen, UA: 0.2 mg/dL (ref 0.0–1.0)

## 2015-03-28 LAB — CYTOLOGY - PAP

## 2016-01-23 MED FILL — PANTOPRAZOLE SOD DR 40 MG T: 40 | 90 days supply | Qty: 90 | Fill #0

## 2016-01-23 MED FILL — MONO-LINYAH 28 TABLET: 0.25-35 | 84 days supply | Qty: 84 | Fill #0

## 2016-04-09 ENCOUNTER — Other Ambulatory Visit: Payer: Self-pay | Admitting: Women's Health

## 2016-04-12 ENCOUNTER — Other Ambulatory Visit: Payer: Self-pay | Admitting: Women's Health

## 2016-04-12 ENCOUNTER — Other Ambulatory Visit: Payer: Self-pay

## 2016-04-12 MED FILL — MONO-LINYAH 28 TABLET: 0.25-35 | 84 days supply | Qty: 84 | Fill #0

## 2016-04-12 NOTE — Telephone Encounter (Signed)
ANNUAL 05/28/16

## 2016-05-28 ENCOUNTER — Encounter: Payer: Self-pay | Admitting: Women's Health

## 2016-05-28 ENCOUNTER — Ambulatory Visit (INDEPENDENT_AMBULATORY_CARE_PROVIDER_SITE_OTHER): Payer: 59 | Admitting: Women's Health

## 2016-05-28 VITALS — BP 126/80 | Ht 65.0 in | Wt 146.0 lb

## 2016-05-28 DIAGNOSIS — Z01419 Encounter for gynecological examination (general) (routine) without abnormal findings: Secondary | ICD-10-CM

## 2016-05-28 DIAGNOSIS — Z3041 Encounter for surveillance of contraceptive pills: Secondary | ICD-10-CM | POA: Diagnosis not present

## 2016-05-28 DIAGNOSIS — Z833 Family history of diabetes mellitus: Secondary | ICD-10-CM

## 2016-05-28 DIAGNOSIS — Z13220 Encounter for screening for lipoid disorders: Secondary | ICD-10-CM | POA: Diagnosis not present

## 2016-05-28 LAB — CBC WITH DIFFERENTIAL/PLATELET
BASOS ABS: 0 {cells}/uL (ref 0–200)
Basophils Relative: 0 %
EOS ABS: 126 {cells}/uL (ref 15–500)
EOS PCT: 2 %
HEMATOCRIT: 39 % (ref 35.0–45.0)
Hemoglobin: 12.9 g/dL (ref 11.7–15.5)
LYMPHS ABS: 2016 {cells}/uL (ref 850–3900)
Lymphocytes Relative: 32 %
MCH: 30.2 pg (ref 27.0–33.0)
MCHC: 33.1 g/dL (ref 32.0–36.0)
MCV: 91.3 fL (ref 80.0–100.0)
MONO ABS: 315 {cells}/uL (ref 200–950)
MPV: 10 fL (ref 7.5–12.5)
Monocytes Relative: 5 %
Neutro Abs: 3843 cells/uL (ref 1500–7800)
Neutrophils Relative %: 61 %
Platelets: 192 10*3/uL (ref 140–400)
RBC: 4.27 MIL/uL (ref 3.80–5.10)
RDW: 13 % (ref 11.0–15.0)
WBC: 6.3 10*3/uL (ref 3.8–10.8)

## 2016-05-28 LAB — HEMOGLOBIN A1C
HEMOGLOBIN A1C: 5.3 % (ref <5.7)
Mean Plasma Glucose: 105 mg/dL

## 2016-05-28 LAB — LIPID PANEL
CHOL/HDL RATIO: 2.7 ratio (ref <=5.0)
CHOLESTEROL: 199 mg/dL (ref 125–200)
HDL: 75 mg/dL (ref >=46)
LDL CALC: 69 mg/dL (ref <130)
Triglycerides: 273 mg/dL — ABNORMAL HIGH (ref <150)
VLDL: 55 mg/dL — ABNORMAL HIGH (ref <30)

## 2016-05-28 MED ORDER — NORETHIN ACE-ETH ESTRAD-FE 1-20 MG-MCG PO TABS: 1.0000 | ORAL_TABLET | Freq: Every day | ORAL | Status: DC

## 2016-05-28 NOTE — Patient Instructions (Signed)
Health Maintenance, Female Adopting a healthy lifestyle and getting preventive care can go a long way to promote health and wellness. Talk with your health care provider about what schedule of regular examinations is right for you. This is a good chance for you to check in with your provider about disease prevention and staying healthy. In between checkups, there are plenty of things you can do on your own. Experts have done a lot of research about which lifestyle changes and preventive measures are most likely to keep you healthy. Ask your health care provider for more information. WEIGHT AND DIET  Eat a healthy diet  Be sure to include plenty of vegetables, fruits, low-fat dairy products, and lean protein.  Do not eat a lot of foods high in solid fats, added sugars, or salt.  Get regular exercise. This is one of the most important things you can do for your health.  Most adults should exercise for at least 150 minutes each week. The exercise should increase your heart rate and make you sweat (moderate-intensity exercise).  Most adults should also do strengthening exercises at least twice a week. This is in addition to the moderate-intensity exercise.  Maintain a healthy weight  Body mass index (BMI) is a measurement that can be used to identify possible weight problems. It estimates body fat based on height and weight. Your health care provider can help determine your BMI and help you achieve or maintain a healthy weight.  For females 20 years of age and older:   A BMI below 18.5 is considered underweight.  A BMI of 18.5 to 24.9 is normal.  A BMI of 25 to 29.9 is considered overweight.  A BMI of 30 and above is considered obese.  Watch levels of cholesterol and blood lipids  You should start having your blood tested for lipids and cholesterol at 39 years of age, then have this test every 5 years.  You may need to have your cholesterol levels checked more often if:  Your lipid  or cholesterol levels are high.  You are older than 39 years of age.  You are at high risk for heart disease.  CANCER SCREENING   Lung Cancer  Lung cancer screening is recommended for adults 55-80 years old who are at high risk for lung cancer because of a history of smoking.  A yearly low-dose CT scan of the lungs is recommended for people who:  Currently smoke.  Have quit within the past 15 years.  Have at least a 30-pack-year history of smoking. A pack year is smoking an average of one pack of cigarettes a day for 1 year.  Yearly screening should continue until it has been 15 years since you quit.  Yearly screening should stop if you develop a health problem that would prevent you from having lung cancer treatment.  Breast Cancer  Practice breast self-awareness. This means understanding how your breasts normally appear and feel.  It also means doing regular breast self-exams. Let your health care provider know about any changes, no matter how small.  If you are in your 20s or 30s, you should have a clinical breast exam (CBE) by a health care provider every 1-3 years as part of a regular health exam.  If you are 40 or older, have a CBE every year. Also consider having a breast X-ray (mammogram) every year.  If you have a family history of breast cancer, talk to your health care provider about genetic screening.  If you   are at high risk for breast cancer, talk to your health care provider about having an MRI and a mammogram every year.  Breast cancer gene (BRCA) assessment is recommended for women who have family members with BRCA-related cancers. BRCA-related cancers include:  Breast.  Ovarian.  Tubal.  Peritoneal cancers.  Results of the assessment will determine the need for genetic counseling and BRCA1 and BRCA2 testing. Cervical Cancer Your health care provider may recommend that you be screened regularly for cancer of the pelvic organs (ovaries, uterus, and  vagina). This screening involves a pelvic examination, including checking for microscopic changes to the surface of your cervix (Pap test). You may be encouraged to have this screening done every 3 years, beginning at age 21.  For women ages 30-65, health care providers may recommend pelvic exams and Pap testing every 3 years, or they may recommend the Pap and pelvic exam, combined with testing for human papilloma virus (HPV), every 5 years. Some types of HPV increase your risk of cervical cancer. Testing for HPV may also be done on women of any age with unclear Pap test results.  Other health care providers may not recommend any screening for nonpregnant women who are considered low risk for pelvic cancer and who do not have symptoms. Ask your health care provider if a screening pelvic exam is right for you.  If you have had past treatment for cervical cancer or a condition that could lead to cancer, you need Pap tests and screening for cancer for at least 20 years after your treatment. If Pap tests have been discontinued, your risk factors (such as having a new sexual partner) need to be reassessed to determine if screening should resume. Some women have medical problems that increase the chance of getting cervical cancer. In these cases, your health care provider may recommend more frequent screening and Pap tests. Colorectal Cancer  This type of cancer can be detected and often prevented.  Routine colorectal cancer screening usually begins at 39 years of age and continues through 39 years of age.  Your health care provider may recommend screening at an earlier age if you have risk factors for colon cancer.  Your health care provider may also recommend using home test kits to check for hidden blood in the stool.  A small camera at the end of a tube can be used to examine your colon directly (sigmoidoscopy or colonoscopy). This is done to check for the earliest forms of colorectal  cancer.  Routine screening usually begins at age 50.  Direct examination of the colon should be repeated every 5-10 years through 39 years of age. However, you may need to be screened more often if early forms of precancerous polyps or small growths are found. Skin Cancer  Check your skin from head to toe regularly.  Tell your health care provider about any new moles or changes in moles, especially if there is a change in a mole's shape or color.  Also tell your health care provider if you have a mole that is larger than the size of a pencil eraser.  Always use sunscreen. Apply sunscreen liberally and repeatedly throughout the day.  Protect yourself by wearing long sleeves, pants, a wide-brimmed hat, and sunglasses whenever you are outside. HEART DISEASE, DIABETES, AND HIGH BLOOD PRESSURE   High blood pressure causes heart disease and increases the risk of stroke. High blood pressure is more likely to develop in:  People who have blood pressure in the high end   of the normal range (130-139/85-89 mm Hg).  People who are overweight or obese.  People who are African American.  If you are 38-23 years of age, have your blood pressure checked every 3-5 years. If you are 61 years of age or older, have your blood pressure checked every year. You should have your blood pressure measured twice--once when you are at a hospital or clinic, and once when you are not at a hospital or clinic. Record the average of the two measurements. To check your blood pressure when you are not at a hospital or clinic, you can use:  An automated blood pressure machine at a pharmacy.  A home blood pressure monitor.  If you are between 45 years and 39 years old, ask your health care provider if you should take aspirin to prevent strokes.  Have regular diabetes screenings. This involves taking a blood sample to check your fasting blood sugar level.  If you are at a normal weight and have a low risk for diabetes,  have this test once every three years after 39 years of age.  If you are overweight and have a high risk for diabetes, consider being tested at a younger age or more often. PREVENTING INFECTION  Hepatitis B  If you have a higher risk for hepatitis B, you should be screened for this virus. You are considered at high risk for hepatitis B if:  You were born in a country where hepatitis B is common. Ask your health care provider which countries are considered high risk.  Your parents were born in a high-risk country, and you have not been immunized against hepatitis B (hepatitis B vaccine).  You have HIV or AIDS.  You use needles to inject street drugs.  You live with someone who has hepatitis B.  You have had sex with someone who has hepatitis B.  You get hemodialysis treatment.  You take certain medicines for conditions, including cancer, organ transplantation, and autoimmune conditions. Hepatitis C  Blood testing is recommended for:  Everyone born from [birth year] through 1964.  Anyone with known risk factors for hepatitis C. Sexually transmitted infections (STIs)  You should be screened for sexually transmitted infections (STIs) including gonorrhea and chlamydia if:  You are sexually active and are younger than 39 years of age.  You are older than 39 years of age and your health care provider tells you that you are at risk for this type of infection.  Your sexual activity has changed since you were last screened and you are at an increased risk for chlamydia or gonorrhea. Ask your health care provider if you are at risk.  If you do not have HIV, but are at risk, it may be recommended that you take a prescription medicine daily to prevent HIV infection. This is called pre-exposure prophylaxis (PrEP). You are considered at risk if:  You are sexually active and do not regularly use condoms or know the HIV status of your partner(s).  You take drugs by injection.  You are sexually  active with a partner who has HIV. Talk with your health care provider about whether you are at high risk of being infected with HIV. If you choose to begin PrEP, you should first be tested for HIV. You should then be tested every 3 months for as long as you are taking PrEP.  PREGNANCY   If you are premenopausal and you may become pregnant, ask your health care provider about preconception counseling.  If you may  become pregnant, take 400 to 800 micrograms (mcg) of folic acid every day.  If you want to prevent pregnancy, talk to your health care provider about birth control (contraception). OSTEOPOROSIS AND MENOPAUSE   Osteoporosis is a disease in which the bones lose minerals and strength with aging. This can result in serious bone fractures. Your risk for osteoporosis can be identified using a bone density scan.  If you are 61 years of age or older, or if you are at risk for osteoporosis and fractures, ask your health care provider if you should be screened.  Ask your health care provider whether you should take a calcium or vitamin D supplement to lower your risk for osteoporosis.  Menopause may have certain physical symptoms and risks.  Hormone replacement therapy may reduce some of these symptoms and risks. Talk to your health care provider about whether hormone replacement therapy is right for you.  HOME CARE INSTRUCTIONS   Schedule regular health, dental, and eye exams.  Stay current with your immunizations.   Do not use any tobacco products including cigarettes, chewing tobacco, or electronic cigarettes.  If you are pregnant, do not drink alcohol.  If you are breastfeeding, limit how much and how often you drink alcohol.  Limit alcohol intake to no more than 1 drink per day for nonpregnant women. One drink equals 12 ounces of beer, 5 ounces of wine, or 1 ounces of hard liquor.  Do not use street drugs.  Do not share needles.  Ask your health care provider for help if  you need support or information about quitting drugs.  Tell your health care provider if you often feel depressed.  Tell your health care provider if you have ever been abused or do not feel safe at home.   This information is not intended to replace advice given to you by your health care provider. Make sure you discuss any questions you have with your health care provider.   Document Released: 06/10/2011 Document Revised: 12/16/2014 Document Reviewed: 10/27/2013 Elsevier Interactive Patient Education [employer].

## 2016-05-28 NOTE — Progress Notes (Signed)
Kristin RutherfordJaime L Neyhart 1976/06/03 [id number]    History:    Presents for annual exam. Monthly cycle on Sprintec. Not sexually active in years. 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 Quit smoking 2013.   Past medical history, past surgical history, family history and social history were all reviewed and documented in the EPIC chart. 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 Mother diabetes and hypertension. Maternal aunt ovarian cancer.  ROS:  A ROS was performed and pertinent positives and negatives are included.  Exam:  Filed Vitals:   05/28/16 1533  BP: 126/80    General appearance:  Normal Thyroid:  Symmetrical, normal in size, without palpable masses or nodularity. Respiratory  Auscultation:  Clear without wheezing or rhonchi Cardiovascular  Auscultation:  Regular rate, without rubs, murmurs or gallops  Edema/varicosities:  Not grossly evident Abdominal  Soft,nontender, without masses, guarding or rebound.  Liver/spleen:  No organomegaly noted  Hernia:  None appreciated  Skin  Inspection:  Grossly normal   Breasts: Examined lying and sitting.     Right: Without masses, retractions, discharge or axillary adenopathy.     Left: Without masses, retractions, discharge or axillary adenopathy. Gentitourinary   Inguinal/mons:  Normal without inguinal adenopathy  External genitalia:  Normal  BUS/Urethra/Skene's glands:  Normal  Vagina:  Normal  Cervix:  Normal  Uterus: normal in size, shape and contour.  Midline and mobile  Adnexa/parametria:     Rt: Without masses or tenderness.   Lt: Without masses or tenderness.  Anus and perineum: Normal  Digital rectal exam: Normal sphincter tone without palpated masses or tenderness  Assessment/Plan:  [age] y.o. S WF G0  for annual exam with no complaints.  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 Will try lower estrogen OC. SBE's, annual screening mammogram at 40, will schedule. CBC, hemoglobin A1c, lipid panel, UA, Pap normal with negative HR HPV typing, new screening guidelines reviewed.  Harrington ChallengerYOUNG,Chet Greenley J WHNP, 4:05 PM 05/28/2016

## 2016-05-29 LAB — URINALYSIS W MICROSCOPIC + REFLEX CULTURE
BACTERIA UA: NONE SEEN [HPF]
BILIRUBIN URINE: NEGATIVE
Casts: NONE SEEN [LPF]
Crystals: NONE SEEN [HPF]
GLUCOSE, UA: NEGATIVE
Hgb urine dipstick: NEGATIVE
Ketones, ur: NEGATIVE
LEUKOCYTES UA: NEGATIVE
NITRITE: NEGATIVE
PH: 5.5 (ref 5.0–8.0)
Protein, ur: NEGATIVE
RBC / HPF: NONE SEEN RBC/HPF (ref <=2)
SPECIFIC GRAVITY, URINE: 1.013 (ref 1.001–1.035)
WBC UA: NONE SEEN WBC/HPF (ref <=5)
YEAST: NONE SEEN [HPF]

## 2016-06-04 ENCOUNTER — Other Ambulatory Visit: Payer: Self-pay | Admitting: Women's Health

## 2016-06-04 DIAGNOSIS — E781 Pure hyperglyceridemia: Secondary | ICD-10-CM

## 2016-07-02 MED FILL — NORETHIN-ESTRAD-FERR 1-0.02: 1-20 | 84 days supply | Qty: 84 | Fill #0 | Status: TO

## 2016-09-25 MED FILL — NORETHIN-ESTRAD-FERR 1-0.02: 1-20 | 84 days supply | Qty: 84 | Fill #0 | Status: TO

## 2016-10-04 MED FILL — SCOPOLAMINE 1 MG/3 DAY PATC: 1 | 9 days supply | Qty: 3 | Fill #0

## 2016-10-08 ENCOUNTER — Other Ambulatory Visit: Payer: 59

## 2016-10-09 ENCOUNTER — Other Ambulatory Visit: Payer: 59

## 2016-10-09 DIAGNOSIS — E781 Pure hyperglyceridemia: Secondary | ICD-10-CM | POA: Diagnosis not present

## 2016-10-09 LAB — LIPID PANEL
CHOLESTEROL: 217 mg/dL — AB (ref 125–200)
HDL: 67 mg/dL (ref >=46)
LDL Cholesterol: 125 mg/dL (ref <130)
Total CHOL/HDL Ratio: 3.2 Ratio (ref <=5.0)
Triglycerides: 123 mg/dL (ref <150)
VLDL: 25 mg/dL (ref <30)

## 2016-12-24 MED FILL — NORETHIN-ESTRAD-FERR 1-0.02: 1-20 | 84 days supply | Qty: 84 | Fill #0 | Status: TO

## 2017-03-10 MED FILL — NORETHIN-ESTRAD-FERR 1-0.02: 1-20 | 84 days supply | Qty: 84 | Fill #0

## 2017-04-23 ENCOUNTER — Encounter: Payer: Self-pay | Admitting: Gynecology

## 2017-06-03 ENCOUNTER — Other Ambulatory Visit: Payer: Self-pay | Admitting: Women's Health

## 2017-06-03 DIAGNOSIS — Z3041 Encounter for surveillance of contraceptive pills: Secondary | ICD-10-CM

## 2017-06-03 MED FILL — NORETHIN-ESTRAD-FERR 1-0.02: 1-20 | 84 days supply | Qty: 84 | Fill #0 | Status: TO

## 2017-07-16 ENCOUNTER — Encounter: Payer: 59 | Admitting: Women's Health

## 2017-08-18 ENCOUNTER — Encounter: Payer: 59 | Admitting: Women's Health

## 2017-08-26 ENCOUNTER — Other Ambulatory Visit: Payer: Self-pay | Admitting: Women's Health

## 2017-08-26 DIAGNOSIS — Z3041 Encounter for surveillance of contraceptive pills: Secondary | ICD-10-CM

## 2017-08-26 MED FILL — LARIN FE 1-20 TABLET: 1-20 | 84 days supply | Qty: 84 | Fill #0

## 2017-08-26 NOTE — Telephone Encounter (Signed)
Overdue for CE. Last 05/28/2016.   She is scheduled for 10/08/17.

## 2017-08-26 NOTE — Telephone Encounter (Signed)
Okay for refill?  

## 2017-08-27 ENCOUNTER — Encounter: Payer: 59 | Admitting: Women's Health

## 2017-09-24 DIAGNOSIS — R42 Dizziness and giddiness: Secondary | ICD-10-CM | POA: Diagnosis not present

## 2017-10-02 DIAGNOSIS — R42 Dizziness and giddiness: Secondary | ICD-10-CM | POA: Diagnosis not present

## 2017-10-08 ENCOUNTER — Ambulatory Visit (INDEPENDENT_AMBULATORY_CARE_PROVIDER_SITE_OTHER): Payer: 59 | Admitting: Women's Health

## 2017-10-08 ENCOUNTER — Encounter: Payer: Self-pay | Admitting: Women's Health

## 2017-10-08 VITALS — BP 138/80 | Ht 65.0 in | Wt 148.0 lb

## 2017-10-08 DIAGNOSIS — Z01419 Encounter for gynecological examination (general) (routine) without abnormal findings: Secondary | ICD-10-CM

## 2017-10-08 DIAGNOSIS — Z3041 Encounter for surveillance of contraceptive pills: Secondary | ICD-10-CM | POA: Diagnosis not present

## 2017-10-08 DIAGNOSIS — Z13220 Encounter for screening for lipoid disorders: Secondary | ICD-10-CM

## 2017-10-08 LAB — CBC WITH DIFFERENTIAL/PLATELET
BASOS ABS: 28 {cells}/uL (ref 0–200)
Basophils Relative: 0.4 %
Eosinophils Absolute: 170 cells/uL (ref 15–500)
Eosinophils Relative: 2.4 %
HEMATOCRIT: 39 % (ref 35.0–45.0)
Hemoglobin: 13.2 g/dL (ref 11.7–15.5)
LYMPHS ABS: 1789 {cells}/uL (ref 850–3900)
MCH: 30.4 pg (ref 27.0–33.0)
MCHC: 33.8 g/dL (ref 32.0–36.0)
MCV: 89.9 fL (ref 80.0–100.0)
MPV: 10.4 fL (ref 7.5–12.5)
Monocytes Relative: 6.7 %
NEUTROS PCT: 65.3 %
Neutro Abs: 4636 cells/uL (ref 1500–7800)
Platelets: 226 10*3/uL (ref 140–400)
RBC: 4.34 10*6/uL (ref 3.80–5.10)
RDW: 12.3 % (ref 11.0–15.0)
TOTAL LYMPHOCYTE: 25.2 %
WBC: 7.1 10*3/uL (ref 3.8–10.8)
WBCMIX: 476 {cells}/uL (ref 200–950)

## 2017-10-08 LAB — LIPID PANEL
Cholesterol: 206 mg/dL — ABNORMAL HIGH (ref <200)
HDL: 81 mg/dL (ref >50)
LDL CHOLESTEROL (CALC): 102 mg/dL — AB
NON-HDL CHOLESTEROL (CALC): 125 mg/dL (ref <130)
TRIGLYCERIDES: 133 mg/dL (ref <150)
Total CHOL/HDL Ratio: 2.5 (calc) (ref <5.0)

## 2017-10-08 LAB — GLUCOSE, RANDOM: GLUCOSE: 84 mg/dL (ref 65–99)

## 2017-10-08 MED ORDER — NORGESTIM-ETH ESTRAD TRIPHASIC 0.18/0.215/0.25 MG-25 MCG PO TABS: 1.0000 | ORAL_TABLET | Freq: Every day | ORAL | 4 refills | Status: DC

## 2017-10-08 NOTE — Progress Notes (Signed)
Roe RutherfordJaime L Hosking 12/07/1976 [id number]    History:    Presents for annual exam.  Monthly cycle on Loestrin, cycles are occasionally lasting greater than 7 days and are heavier, states didn't much better on Sprintec, changed to Loestrin last year. Requesting to go back. 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 Has not had a screening mammogram. Not sexually active in several years denies need for STD screening.  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  ROS:  A ROS was performed and pertinent positives and negatives are included.  Exam:  Vitals:   10/08/17 1102  BP: 138/80  Weight: 148 lb (67.1 kg)  Height: 5\' 5"  (1.651 m)   Body mass index is 24.63 kg/m.   General appearance:  Normal Thyroid:  Symmetrical, normal in size, without palpable masses or nodularity. Respiratory  Auscultation:  Clear without wheezing or rhonchi Cardiovascular  Auscultation:  Regular rate, without rubs, murmurs or gallops  Edema/varicosities:  Not grossly evident Abdominal  Soft,nontender, without masses, guarding or rebound.  Liver/spleen:  No organomegaly noted  Hernia:  None appreciated  Skin  Inspection:  Grossly normal   Breasts: Examined lying and sitting.     Right: Without masses, retractions, discharge or axillary adenopathy.     Left: Without masses, retractions, discharge or axillary adenopathy. Gentitourinary   Inguinal/mons:  Normal without inguinal adenopathy  External genitalia:  Normal  BUS/Urethra/Skene's glands:  Normal  Vagina:  Normal  Cervix:  Normal  Uterus:  normal in size, shape and contour.  Midline and mobile  Adnexa/parametria:     Rt: Without masses or tenderness.   Lt: Without masses or tenderness.  Anus and perineum: Normal  Digital rectal exam: Normal sphincter tone without palpated masses or  tenderness  Assessment/Plan:  41 y.o. S WF G0 for annual exam with no complaints.  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 SBE's, annual screening mammogram, breast center information given instructed to call and schedule. Continue healthy lifestyle with regular exercise and healthy diet, vitamin D 1000 daily encouraged. CBC, glucose, lipid panel, Pap with HR HPV typing, new screening guidelines reviewed.    Harrington Challengerancy J Tymothy Cass Lac/[facility]cl[facility] Medical CenterWHNP, 12:56 PM 10/08/2017

## 2017-10-08 NOTE — Patient Instructions (Signed)
[facility]  [phone]  Health Maintenance, Female Adopting a healthy lifestyle and getting preventive care can go a long way to promote health and wellness. Talk with your health care provider about what schedule of regular examinations is right for you. This is a good chance for you to check in with your provider about disease prevention and staying healthy. In between checkups, there are plenty of things you can do on your own. Experts have done a lot of research about which lifestyle changes and preventive measures are most likely to keep you healthy. Ask your health care provider for more information. Weight and diet Eat a healthy diet  Be sure to include plenty of vegetables, fruits, low-fat dairy products, and lean protein.  Do not eat a lot of foods high in solid fats, added sugars, or salt.  Get regular exercise. This is one of the most important things you can do for your health. ? Most adults should exercise for at least 150 minutes each week. The exercise should increase your heart rate and make you sweat (moderate-intensity exercise). ? Most adults should also do strengthening exercises at least twice a week. This is in addition to the moderate-intensity exercise.  Maintain a healthy weight  Body mass index (BMI) is a measurement that can be used to identify possible weight problems. It estimates body fat based on height and weight. Your health care provider can help determine your BMI and help you achieve or maintain a healthy weight.  For females 20 years of age and older: ? A BMI below 18.5 is considered underweight. ? A BMI of 18.5 to 24.9 is normal. ? A BMI of 25 to 29.9 is considered overweight. ? A BMI of 30 and above is considered obese.  Watch levels of cholesterol and blood lipids  You should start having your blood tested for lipids and cholesterol at 40 years of age, then have this test every 5 years.  You may need to have your cholesterol levels checked more  often if: ? Your lipid or cholesterol levels are high. ? You are older than 40 years of age. ? You are at high risk for heart disease.  Cancer screening Lung Cancer  Lung cancer screening is recommended for adults 55-80 years old who are at high risk for lung cancer because of a history of smoking.  A yearly low-dose CT scan of the lungs is recommended for people who: ? Currently smoke. ? Have quit within the past 15 years. ? Have at least a 30-pack-year history of smoking. A pack year is smoking an average of one pack of cigarettes a day for 1 year.  Yearly screening should continue until it has been 15 years since you quit.  Yearly screening should stop if you develop a health problem that would prevent you from having lung cancer treatment.  Breast Cancer  Practice breast self-awareness. This means understanding how your breasts normally appear and feel.  It also means doing regular breast self-exams. Let your health care provider know about any changes, no matter how small.  If you are in your 20s or 30s, you should have a clinical breast exam (CBE) by a health care provider every 1-3 years as part of a regular health exam.  If you are 40 or older, have a CBE every year. Also consider having a breast X-ray (mammogram) every year.  If you have a family history of breast cancer, talk to your health care provider about genetic screening.  If   you are at high risk for breast cancer, talk to your health care provider about having an MRI and a mammogram every year.  Breast cancer gene (BRCA) assessment is recommended for women who have family members with BRCA-related cancers. BRCA-related cancers include: ? Breast. ? Ovarian. ? Tubal. ? Peritoneal cancers.  Results of the assessment will determine the need for genetic counseling and BRCA1 and BRCA2 testing.  Cervical Cancer Your health care provider may recommend that you be screened regularly for cancer of the pelvic organs  (ovaries, uterus, and vagina). This screening involves a pelvic examination, including checking for microscopic changes to the surface of your cervix (Pap test). You may be encouraged to have this screening done every 3 years, beginning at age 21.  For women ages 30-65, health care providers may recommend pelvic exams and Pap testing every 3 years, or they may recommend the Pap and pelvic exam, combined with testing for human papilloma virus (HPV), every 5 years. Some types of HPV increase your risk of cervical cancer. Testing for HPV may also be done on women of any age with unclear Pap test results.  Other health care providers may not recommend any screening for nonpregnant women who are considered low risk for pelvic cancer and who do not have symptoms. Ask your health care provider if a screening pelvic exam is right for you.  If you have had past treatment for cervical cancer or a condition that could lead to cancer, you need Pap tests and screening for cancer for at least 20 years after your treatment. If Pap tests have been discontinued, your risk factors (such as having a new sexual partner) need to be reassessed to determine if screening should resume. Some women have medical problems that increase the chance of getting cervical cancer. In these cases, your health care provider may recommend more frequent screening and Pap tests.  Colorectal Cancer  This type of cancer can be detected and often prevented.  Routine colorectal cancer screening usually begins at 40 years of age and continues through 40 years of age.  Your health care provider may recommend screening at an earlier age if you have risk factors for colon cancer.  Your health care provider may also recommend using home test kits to check for hidden blood in the stool.  A small camera at the end of a tube can be used to examine your colon directly (sigmoidoscopy or colonoscopy). This is done to check for the earliest forms of  colorectal cancer.  Routine screening usually begins at age 50.  Direct examination of the colon should be repeated every 5-10 years through 40 years of age. However, you may need to be screened more often if early forms of precancerous polyps or small growths are found.  Skin Cancer  Check your skin from head to toe regularly.  Tell your health care provider about any new moles or changes in moles, especially if there is a change in a mole's shape or color.  Also tell your health care provider if you have a mole that is larger than the size of a pencil eraser.  Always use sunscreen. Apply sunscreen liberally and repeatedly throughout the day.  Protect yourself by wearing long sleeves, pants, a wide-brimmed hat, and sunglasses whenever you are outside.  Heart disease, diabetes, and high blood pressure  High blood pressure causes heart disease and increases the risk of stroke. High blood pressure is more likely to develop in: ? People who have blood pressure   in the high end of the normal range (130-139/85-89 mm Hg). ? People who are overweight or obese. ? People who are African American.  If you are 18-39 years of age, have your blood pressure checked every 3-5 years. If you are 40 years of age or older, have your blood pressure checked every year. You should have your blood pressure measured twice-once when you are at a hospital or clinic, and once when you are not at a hospital or clinic. Record the average of the two measurements. To check your blood pressure when you are not at a hospital or clinic, you can use: ? An automated blood pressure machine at a pharmacy. ? A home blood pressure monitor.  If you are between 55 years and 79 years old, ask your health care provider if you should take aspirin to prevent strokes.  Have regular diabetes screenings. This involves taking a blood sample to check your fasting blood sugar level. ? If you are at a normal weight and have a low risk  for diabetes, have this test once every three years after 40 years of age. ? If you are overweight and have a high risk for diabetes, consider being tested at a younger age or more often. Preventing infection Hepatitis B  If you have a higher risk for hepatitis B, you should be screened for this virus. You are considered at high risk for hepatitis B if: ? You were born in a country where hepatitis B is common. Ask your health care provider which countries are considered high risk. ? Your parents were born in a high-risk country, and you have not been immunized against hepatitis B (hepatitis B vaccine). ? You have HIV or AIDS. ? You use needles to inject street drugs. ? You live with someone who has hepatitis B. ? You have had sex with someone who has hepatitis B. ? You get hemodialysis treatment. ? You take certain medicines for conditions, including cancer, organ transplantation, and autoimmune conditions.  Hepatitis C  Blood testing is recommended for: ? Everyone born from 1945 through 1965. ? Anyone with known risk factors for hepatitis C.  Sexually transmitted infections (STIs)  You should be screened for sexually transmitted infections (STIs) including gonorrhea and chlamydia if: ? You are sexually active and are younger than 40 years of age. ? You are older than 40 years of age and your health care provider tells you that you are at risk for this type of infection. ? Your sexual activity has changed since you were last screened and you are at an increased risk for chlamydia or gonorrhea. Ask your health care provider if you are at risk.  If you do not have HIV, but are at risk, it may be recommended that you take a prescription medicine daily to prevent HIV infection. This is called pre-exposure prophylaxis (PrEP). You are considered at risk if: ? You are sexually active and do not regularly use condoms or know the HIV status of your partner(s). ? You take drugs by  injection. ? You are sexually active with a partner who has HIV.  Talk with your health care provider about whether you are at high risk of being infected with HIV. If you choose to begin PrEP, you should first be tested for HIV. You should then be tested every 3 months for as long as you are taking PrEP. Pregnancy  If you are premenopausal and you may become pregnant, ask your health care provider about preconception counseling.    If you may become pregnant, take 400 to 800 micrograms (mcg) of folic acid every day.  If you want to prevent pregnancy, talk to your health care provider about birth control (contraception). Osteoporosis and menopause  Osteoporosis is a disease in which the bones lose minerals and strength with aging. This can result in serious bone fractures. Your risk for osteoporosis can be identified using a bone density scan.  If you are 65 years of age or older, or if you are at risk for osteoporosis and fractures, ask your health care provider if you should be screened.  Ask your health care provider whether you should take a calcium or vitamin D supplement to lower your risk for osteoporosis.  Menopause may have certain physical symptoms and risks.  Hormone replacement therapy may reduce some of these symptoms and risks. Talk to your health care provider about whether hormone replacement therapy is right for you. Follow these instructions at home:  Schedule regular health, dental, and eye exams.  Stay current with your immunizations.  Do not use any tobacco products including cigarettes, chewing tobacco, or electronic cigarettes.  If you are pregnant, do not drink alcohol.  If you are breastfeeding, limit how much and how often you drink alcohol.  Limit alcohol intake to no more than 1 drink per day for nonpregnant women. One drink equals 12 ounces of beer, 5 ounces of wine, or 1 ounces of hard liquor.  Do not use street drugs.  Do not share needles.  Ask  your health care provider for help if you need support or information about quitting drugs.  Tell your health care provider if you often feel depressed.  Tell your health care provider if you have ever been abused or do not feel safe at home. This information is not intended to replace advice given to you by your health care provider. Make sure you discuss any questions you have with your health care provider. Document Released: 06/10/2011 Document Revised: 05/02/2016 Document Reviewed: 08/29/2015 Elsevier Interactive Patient Education  2018 [employer]  

## 2017-10-10 LAB — PAP, TP IMAGING W/ HPV RNA, RFLX HPV TYPE 16,18/45: HPV DNA High Risk: NOT DETECTED

## 2017-10-20 ENCOUNTER — Other Ambulatory Visit: Payer: Self-pay | Admitting: Women's Health

## 2017-10-20 DIAGNOSIS — Z1231 Encounter for screening mammogram for malignant neoplasm of breast: Secondary | ICD-10-CM

## 2017-10-22 MED FILL — TRI-LO-MARZIA TABLET: 0.18/0.215/ | 84 days supply | Qty: 84 | Fill #0

## 2017-12-18 ENCOUNTER — Ambulatory Visit
Admission: RE | Admit: 2017-12-18 | Discharge: 2017-12-18 | Disposition: A | Discharge status: Home / Self Care | Payer: 59 | Source: Ambulatory Visit | Attending: Women's Health | Admitting: Women's Health

## 2017-12-18 DIAGNOSIS — Z1231 Encounter for screening mammogram for malignant neoplasm of breast: Secondary | ICD-10-CM | POA: Diagnosis not present

## 2017-12-19 ENCOUNTER — Encounter: Payer: Self-pay | Admitting: Women's Health

## 2018-01-15 MED FILL — TRI-LO-MARZIA TABLET: 0.18/0.215/ | 84 days supply | Qty: 84 | Fill #1

## 2018-04-13 MED FILL — NORG-EE 0.18-0.215-0.25/0.0: 0.18/0.215/ | 84 days supply | Qty: 84 | Fill #2

## 2018-05-21 DIAGNOSIS — J069 Acute upper respiratory infection, unspecified: Secondary | ICD-10-CM | POA: Diagnosis not present

## 2018-05-21 DIAGNOSIS — R0982 Postnasal drip: Secondary | ICD-10-CM | POA: Diagnosis not present

## 2018-05-21 DIAGNOSIS — R05 Cough: Secondary | ICD-10-CM | POA: Diagnosis not present

## 2018-05-21 MED FILL — CHERATUSSIN AC SYRUP: 100-10 | 3 days supply | Qty: 120 | Fill #0

## 2018-05-21 MED FILL — AZELASTINE HCL 137 MCG SPRY: 0.1 | 30 days supply | Qty: 30 | Fill #0

## 2018-07-02 MED FILL — NORG-EE 0.18-0.215-0.25/0.0: 0.18/0.215/ | 84 days supply | Qty: 84 | Fill #3

## 2018-09-28 MED FILL — NORG-EE 0.18-0.215-0.25/0.0: 0.18/0.215/ | 84 days supply | Qty: 84 | Fill #4

## 2018-09-28 MED FILL — AZELASTINE HCL 137 MCG/SPRA: 137 | 25 days supply | Qty: 30 | Fill #0

## 2018-10-12 ENCOUNTER — Encounter: Payer: Self-pay | Admitting: Women's Health

## 2018-10-12 ENCOUNTER — Ambulatory Visit (INDEPENDENT_AMBULATORY_CARE_PROVIDER_SITE_OTHER): Payer: 59 | Admitting: Women's Health

## 2018-10-12 VITALS — BP 122/80 | Ht 65.0 in | Wt 154.0 lb

## 2018-10-12 DIAGNOSIS — Z01419 Encounter for gynecological examination (general) (routine) without abnormal findings: Secondary | ICD-10-CM

## 2018-10-12 DIAGNOSIS — Z3041 Encounter for surveillance of contraceptive pills: Secondary | ICD-10-CM | POA: Diagnosis not present

## 2018-10-12 DIAGNOSIS — Z13220 Encounter for screening for lipoid disorders: Secondary | ICD-10-CM | POA: Diagnosis not present

## 2018-10-12 LAB — COMPREHENSIVE METABOLIC PANEL
AG RATIO: 1.6 (calc) (ref 1.0–2.5)
ALKALINE PHOSPHATASE (APISO): 37 U/L (ref 33–115)
ALT: 8 U/L (ref 6–29)
AST: 13 U/L (ref 10–30)
Albumin: 4.2 g/dL (ref 3.6–5.1)
BILIRUBIN TOTAL: 0.4 mg/dL (ref 0.2–1.2)
BUN: 10 mg/dL (ref 7–25)
CHLORIDE: 102 mmol/L (ref 98–110)
CO2: 25 mmol/L (ref 20–32)
Calcium: 9.2 mg/dL (ref 8.6–10.2)
Creat: 0.81 mg/dL (ref 0.50–1.10)
Globulin: 2.7 g/dL (calc) (ref 1.9–3.7)
Glucose, Bld: 89 mg/dL (ref 65–99)
Potassium: 3.9 mmol/L (ref 3.5–5.3)
SODIUM: 137 mmol/L (ref 135–146)
TOTAL PROTEIN: 6.9 g/dL (ref 6.1–8.1)

## 2018-10-12 LAB — LIPID PANEL
CHOLESTEROL: 197 mg/dL (ref <200)
HDL: 72 mg/dL (ref >50)
LDL Cholesterol (Calc): 94 mg/dL (calc)
NON-HDL CHOLESTEROL (CALC): 125 mg/dL (ref <130)
Total CHOL/HDL Ratio: 2.7 (calc) (ref <5.0)
Triglycerides: 223 mg/dL — ABNORMAL HIGH (ref <150)

## 2018-10-12 LAB — CBC WITH DIFFERENTIAL/PLATELET
BASOS ABS: 62 {cells}/uL (ref 0–200)
Basophils Relative: 0.8 %
EOS ABS: 231 {cells}/uL (ref 15–500)
EOS PCT: 3 %
HEMATOCRIT: 41.6 % (ref 35.0–45.0)
HEMOGLOBIN: 14 g/dL (ref 11.7–15.5)
LYMPHS ABS: 2318 {cells}/uL (ref 850–3900)
MCH: 31.2 pg (ref 27.0–33.0)
MCHC: 33.7 g/dL (ref 32.0–36.0)
MCV: 92.7 fL (ref 80.0–100.0)
MPV: 10.6 fL (ref 7.5–12.5)
Monocytes Relative: 5.2 %
NEUTROS ABS: 4689 {cells}/uL (ref 1500–7800)
Neutrophils Relative %: 60.9 %
Platelets: 238 10*3/uL (ref 140–400)
RBC: 4.49 10*6/uL (ref 3.80–5.10)
RDW: 12.2 % (ref 11.0–15.0)
Total Lymphocyte: 30.1 %
WBC: 7.7 10*3/uL (ref 3.8–10.8)
WBCMIX: 400 {cells}/uL (ref 200–950)

## 2018-10-12 MED ORDER — NORGESTIMATE-ETH ESTRADIOL 0.25-35 MG-MCG PO TABS: 1.0000 | ORAL_TABLET | Freq: Every day | ORAL | 4 refills | Status: DC

## 2018-10-12 MED ORDER — NORGESTIM-ETH ESTRAD TRIPHASIC 0.18/0.215/0.25 MG-25 MCG PO TABS: 1.0000 | ORAL_TABLET | Freq: Every day | ORAL | 4 refills | Status: DC

## 2018-10-12 MED FILL — FEMYNOR 0.25-35 MG-MCG TABS: 0.25-35 | 84 days supply | Qty: 84 | Fill #0

## 2018-10-12 NOTE — Progress Notes (Signed)
Kristin Esparza [birth year] [id number]    History:    Presents for annual exam.  Monthly cycle with spotting the week prior to cycle since starting Ortho Tri-Cyclen Lo.  Had regular monthly 5 to 6-day cycles on Sprintec and would like to go back on that.  Not sexually active in several years denies need for STD screen.  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  Normal mammogram history.  Did not receive Gardasil.  Past medical history, past surgical history, family history and social history were all reviewed and documented in the EPIC chart.  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  ROS:  A ROS was performed and pertinent positives and negatives are included.  Exam:  Vitals:   10/12/18 1114  BP: 122/80  Weight: 154 lb (69.9 kg)  Height: 5\' 5"  (1.651 m)   Body mass index is 25.63 kg/m.   General appearance:  Normal Thyroid:  Symmetrical, normal in size, without palpable masses or nodularity. Respiratory  Auscultation:  Clear without wheezing or rhonchi Cardiovascular  Auscultation:  Regular rate, without rubs, murmurs or gallops  Edema/varicosities:  Not grossly evident Abdominal  Soft,nontender, without masses, guarding or rebound.  Liver/spleen:  No organomegaly noted  Hernia:  None appreciated  Skin  Inspection:  Grossly normal   Breasts: Examined lying and sitting.     Right: Without masses, retractions, discharge or axillary adenopathy.     Left: Without masses, retractions, discharge or axillary adenopathy. Gentitourinary   Inguinal/mons:  Normal without inguinal adenopathy  External genitalia:  Normal  BUS/Urethra/Skene's glands:  Normal  Vagina:  Normal  Cervix:  Normal  Uterus:  normal in size, shape and contour.  Midline and mobile  Adnexa/parametria:     Rt: Without masses or tenderness.   Lt: Without masses or tenderness.  Anus and perineum: Normal  Digital rectal exam: Normal sphincter tone without palpated masses or tenderness  Assessment/Plan:   42 y.o. S WF G0 for annual exam with no complaints.  Monthly cycle on Ortho Tri-Cyclen Lo with spotting week 3 of pills. 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  Instructed to call if spotting week 3 of pill pack we will proceed with further testing.  SBE's, continue annual screening mammogram, calcium rich foods, vitamin D 1000 daily encouraged.  Condoms encouraged if sexually active.  Reviewed importance of increasing regular cardio type exercise.  CBC, CMP, lipid panel, Pap normal 2018, new screening guidelines reviewed.  [facility], 11:40 AM 10/12/2018

## 2018-10-12 NOTE — Patient Instructions (Signed)

## 2018-10-13 LAB — URINALYSIS, COMPLETE W/RFL CULTURE
BACTERIA UA: NONE SEEN /HPF
Bilirubin Urine: NEGATIVE
Glucose, UA: NEGATIVE
HGB URINE DIPSTICK: NEGATIVE
HYALINE CAST: NONE SEEN /LPF
Ketones, ur: NEGATIVE
Leukocyte Esterase: NEGATIVE
Nitrites, Initial: NEGATIVE
PH: 7 (ref 5.0–8.0)
PROTEIN: NEGATIVE
RBC / HPF: NONE SEEN /HPF (ref 0–2)
Specific Gravity, Urine: 1.022 (ref 1.001–1.03)
Squamous Epithelial / LPF: NONE SEEN /HPF (ref <=5)
WBC UA: NONE SEEN /HPF (ref 0–5)

## 2018-10-13 LAB — NO CULTURE INDICATED

## 2018-12-15 ENCOUNTER — Other Ambulatory Visit: Payer: Self-pay | Admitting: Women's Health

## 2018-12-15 DIAGNOSIS — Z1231 Encounter for screening mammogram for malignant neoplasm of breast: Secondary | ICD-10-CM

## 2019-01-12 ENCOUNTER — Ambulatory Visit
Admission: RE | Admit: 2019-01-12 | Discharge: 2019-01-12 | Disposition: A | Discharge status: Home / Self Care | Payer: 59 | Source: Ambulatory Visit | Attending: Women's Health | Admitting: Women's Health

## 2019-01-12 DIAGNOSIS — Z1231 Encounter for screening mammogram for malignant neoplasm of breast: Secondary | ICD-10-CM

## 2019-01-14 ENCOUNTER — Other Ambulatory Visit: Payer: Self-pay | Admitting: Women's Health

## 2019-01-14 DIAGNOSIS — R928 Other abnormal and inconclusive findings on diagnostic imaging of breast: Secondary | ICD-10-CM

## 2019-01-15 MED FILL — FEMYNOR 0.25-35 MG-MCG TABS: 0.25-35 | 84 days supply | Qty: 84 | Fill #0

## 2019-01-26 ENCOUNTER — Ambulatory Visit
Admission: RE | Admit: 2019-01-26 | Discharge: 2019-01-26 | Disposition: A | Discharge status: Home / Self Care | Payer: 59 | Source: Ambulatory Visit | Attending: Women's Health | Admitting: Women's Health

## 2019-01-26 DIAGNOSIS — R928 Other abnormal and inconclusive findings on diagnostic imaging of breast: Secondary | ICD-10-CM

## 2019-01-26 DIAGNOSIS — N6489 Other specified disorders of breast: Secondary | ICD-10-CM | POA: Diagnosis not present

## 2019-01-26 DIAGNOSIS — R922 Inconclusive mammogram: Secondary | ICD-10-CM | POA: Diagnosis not present

## 2019-04-02 MED FILL — FEMYNOR 0.25-35 MG-MCG TABS: 0.25-35 | 84 days supply | Qty: 84 | Fill #1

## 2019-07-01 MED FILL — FEMYNOR 0.25-35 MG-MCG TABS: 0.25-35 | 84 days supply | Qty: 84 | Fill #0

## 2019-09-17 IMAGING — US ULTRASOUND RIGHT BREAST LIMITED
1 series · 1 of 1 positions shown · non-contrast
Comparison: Previous exam(s).

CLINICAL DATA: 42-year-old female recalled from screening mammogram
dated 01/12/2019 for a possible right breast mass.

EXAM:
DIGITAL DIAGNOSTIC RIGHT MAMMOGRAM WITH CAD AND TOMO
ULTRASOUND RIGHT BREAST

[Series 1: ultrasound right breast limited · 0.07mm/px · 1 of 1 slices shown]
[im 1/1]
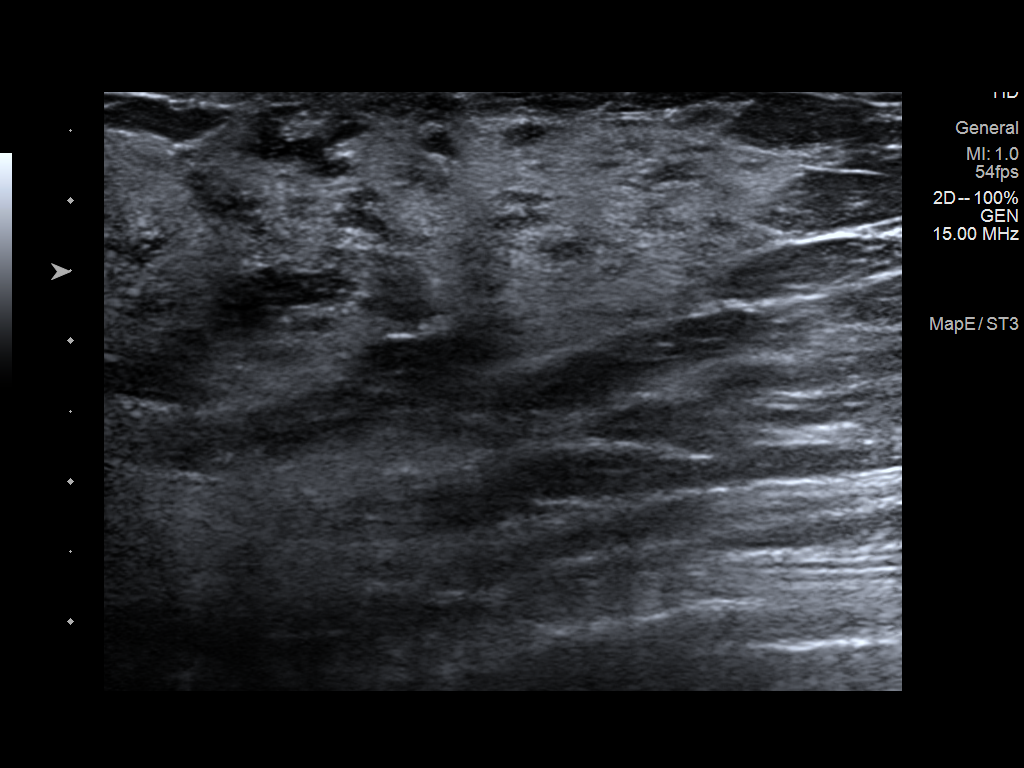

[1 of 1 positions shown; findings below may reference images not displayed]

ACR Breast Density Category c: The breast tissue is heterogeneously
dense, which may obscure small masses.
FINDINGS: The previously described, possible mass in the inferior periareolar
right breast is not definitively persist on today's additional
mammographic views. The area effaces into fibroglandular tissue.
Precautionary ultrasound was performed.

Mammographic images were processed with CAD.

Targeted ultrasound is performed, showing dense fibroglandular
tissue without focal or suspicious sonographic abnormality.
Evaluation of the central and subareolar right breast was performed.
IMPRESSION: No persistent, suspicious mammographic or sonographic findings
corresponding with the screening mammographic abnormality.

RECOMMENDATION:
Screening mammogram in one year.(Code:JX-[id number])

I have discussed the findings and recommendations with the patient.
Results were also provided in writing at the conclusion of the
visit. If applicable, a reminder letter will be sent to the patient
regarding the next appointment.

BI-RADS CATEGORY  1: Negative.

## 2019-09-23 MED FILL — FEMYNOR 0.25-35 MG-MCG TABS: 0.25-35 | 84 days supply | Qty: 84 | Fill #1

## 2019-10-14 MED FILL — LORazepam 1 MG TABS: 1 | 2 days supply | Qty: 4 | Fill #0

## 2019-10-19 ENCOUNTER — Encounter: Payer: Self-pay | Admitting: Women's Health

## 2019-10-19 ENCOUNTER — Ambulatory Visit (INDEPENDENT_AMBULATORY_CARE_PROVIDER_SITE_OTHER): Payer: 59 | Admitting: Women's Health

## 2019-10-19 ENCOUNTER — Other Ambulatory Visit: Payer: Self-pay

## 2019-10-19 VITALS — BP 122/80 | Ht 65.0 in | Wt 144.0 lb

## 2019-10-19 DIAGNOSIS — Z01419 Encounter for gynecological examination (general) (routine) without abnormal findings: Secondary | ICD-10-CM | POA: Diagnosis not present

## 2019-10-19 DIAGNOSIS — Z13220 Encounter for screening for lipoid disorders: Secondary | ICD-10-CM

## 2019-10-19 MED ORDER — IBUPROFEN 600 MG PO TABS: 600.0000 mg | ORAL_TABLET | Freq: Four times a day (QID) | ORAL | 1 refills | Status: AC | PRN

## 2019-10-19 MED ORDER — NORGESTIMATE-ETH ESTRADIOL 0.25-35 MG-MCG PO TABS: 1.0000 | ORAL_TABLET | Freq: Every day | ORAL | 4 refills | Status: DC

## 2019-10-19 MED FILL — IBUPROFEN 600 MG TABLET: 600 | 15 days supply | Qty: 60 | Fill #0

## 2019-10-19 NOTE — Progress Notes (Signed)
Kristin Esparza August 25, 1976 [id number]    History:    Presents for annual exam.  Monthly cycle on Sprintec without complaint.  Not sexually active denies need for STD screen.  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  Normal mammogram history.  Has lost 10 pounds with diet and exercise.  Past medical history, past surgical history, family history and social history were all reviewed and documented in the EPIC chart.  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  ROS:  A ROS was performed and pertinent positives and negatives are included.  Exam:  Vitals:   10/19/19 1508  BP: 122/80  Weight: 144 lb (65.3 kg)  Height: 5\' 5"  (1.651 m)   Body mass index is 23.96 kg/m.   General appearance:  Normal Thyroid:  Symmetrical, normal in size, without palpable masses or nodularity. Respiratory  Auscultation:  Clear without wheezing or rhonchi Cardiovascular  Auscultation:  Regular rate, without rubs, murmurs or gallops  Edema/varicosities:  Not grossly evident Abdominal  Soft,nontender, without masses, guarding or rebound.  Liver/spleen:  No organomegaly noted  Hernia:  None appreciated  Skin  Inspection:  Grossly normal   Breasts: Examined lying and sitting.     Right: Without masses, retractions, discharge or axillary adenopathy.     Left: Without masses, retractions, discharge or axillary adenopathy. Gentitourinary   Inguinal/mons:  Normal without inguinal adenopathy  External genitalia:  Normal  BUS/Urethra/Skene's glands:  Normal  Vagina:  Normal  Cervix:  Normal  Uterus:  normal in size, shape and contour.  Midline and mobile  Adnexa/parametria:     Rt: Without masses or tenderness.   Lt: Without masses or tenderness.  Anus and perineum: Normal  Digital rectal exam: Normal sphincter tone without palpated masses or tenderness  Assessment/Plan:  43 y.o. S WF G0 for annual exam with no complaints.  Monthly cycle on Sprintec 2008 CIN-1 cryo normal Paps  after  Plan: Sprintec prescription, proper use given and reviewed slight risk for blood clots and strokes.  SBEs, continue annual screening mammogram, calcium rich foods, vitamin D 1000 daily encouraged.  Congratulated on weight loss with diet and exercise continue healthy lifestyle.  CBC, CMP, lipid panel, Pap normal with negative HR HPV 2018, new screening guidelines reviewed.    [place], 3:56 PM 10/19/2019

## 2019-10-19 NOTE — Patient Instructions (Signed)
Health Maintenance, Female Adopting a healthy lifestyle and getting preventive care are important in promoting health and wellness. Ask your health care provider about:  The right schedule for you to have regular tests and exams.  Things you can do on your own to prevent diseases and keep yourself healthy. What should I know about diet, weight, and exercise? Eat a healthy diet   Eat a diet that includes plenty of vegetables, fruits, low-fat dairy products, and lean protein.  Do not eat a lot of foods that are high in solid fats, added sugars, or sodium. Maintain a healthy weight Body mass index (BMI) is used to identify weight problems. It estimates body fat based on height and weight. Your health care provider can help determine your BMI and help you achieve or maintain a healthy weight. Get regular exercise Get regular exercise. This is one of the most important things you can do for your health. Most adults should:  Exercise for at least 150 minutes each week. The exercise should increase your heart rate and make you sweat (moderate-intensity exercise).  Do strengthening exercises at least twice a week. This is in addition to the moderate-intensity exercise.  Spend less time sitting. Even light physical activity can be beneficial. Watch cholesterol and blood lipids Have your blood tested for lipids and cholesterol at 42 years of age, then have this test every 5 years. Have your cholesterol levels checked more often if:  Your lipid or cholesterol levels are high.  You are older than 42 years of age.  You are at high risk for heart disease. What should I know about cancer screening? Depending on your health history and family history, you may need to have cancer screening at various ages. This may include screening for:  Breast cancer.  Cervical cancer.  Colorectal cancer.  Skin cancer.  Lung cancer. What should I know about heart disease, diabetes, and high blood  pressure? Blood pressure and heart disease  High blood pressure causes heart disease and increases the risk of stroke. This is more likely to develop in people who have high blood pressure readings, are of African descent, or are overweight.  Have your blood pressure checked: ? Every 3-5 years if you are 18-39 years of age. ? Every year if you are 40 years old or older. Diabetes Have regular diabetes screenings. This checks your fasting blood sugar level. Have the screening done:  Once every three years after age 40 if you are at a normal weight and have a low risk for diabetes.  More often and at a younger age if you are overweight or have a high risk for diabetes. What should I know about preventing infection? Hepatitis B If you have a higher risk for hepatitis B, you should be screened for this virus. Talk with your health care provider to find out if you are at risk for hepatitis B infection. Hepatitis C Testing is recommended for:  Everyone born from 1945 through 1965.  Anyone with known risk factors for hepatitis C. Sexually transmitted infections (STIs)  Get screened for STIs, including gonorrhea and chlamydia, if: ? You are sexually active and are younger than 42 years of age. ? You are older than 42 years of age and your health care provider tells you that you are at risk for this type of infection. ? Your sexual activity has changed since you were last screened, and you are at increased risk for chlamydia or gonorrhea. Ask your health care provider if   you are at risk.  Ask your health care provider about whether you are at high risk for HIV. Your health care provider may recommend a prescription medicine to help prevent HIV infection. If you choose to take medicine to prevent HIV, you should first get tested for HIV. You should then be tested every 3 months for as long as you are taking the medicine. Pregnancy  If you are about to stop having your period (premenopausal) and  you may become pregnant, seek counseling before you get pregnant.  Take 400 to 800 micrograms (mcg) of folic acid every day if you become pregnant.  Ask for birth control (contraception) if you want to prevent pregnancy. Osteoporosis and menopause Osteoporosis is a disease in which the bones lose minerals and strength with aging. This can result in bone fractures. If you are 65 years old or older, or if you are at risk for osteoporosis and fractures, ask your health care provider if you should:  Be screened for bone loss.  Take a calcium or vitamin D supplement to lower your risk of fractures.  Be given hormone replacement therapy (HRT) to treat symptoms of menopause. Follow these instructions at home: Lifestyle  Do not use any products that contain nicotine or tobacco, such as cigarettes, e-cigarettes, and chewing tobacco. If you need help quitting, ask your health care provider.  Do not use street drugs.  Do not share needles.  Ask your health care provider for help if you need support or information about quitting drugs. Alcohol use  Do not drink alcohol if: ? Your health care provider tells you not to drink. ? You are pregnant, may be pregnant, or are planning to become pregnant.  If you drink alcohol: ? Limit how much you use to 0-1 drink a day. ? Limit intake if you are breastfeeding.  Be aware of how much alcohol is in your drink. In the [place] one drink equals one 12 oz bottle of beer (355 mL), one 5 oz glass of wine (148 mL), or one 1 oz glass of hard liquor (44 mL). General instructions  Schedule regular health, dental, and eye exams.  Stay current with your vaccines.  Tell your health care provider if: ? You often feel depressed. ? You have ever been abused or do not feel safe at home. Summary  Adopting a healthy lifestyle and getting preventive care are important in promoting health and wellness.  Follow your health care provider's instructions about healthy  diet, exercising, and getting tested or screened for diseases.  Follow your health care provider's instructions on monitoring your cholesterol and blood pressure. This information is not intended to replace advice given to you by your health care provider. Make sure you discuss any questions you have with your health care provider. Document Released: 06/10/2011 Document Revised: 11/18/2018 Document Reviewed: 11/18/2018 Elsevier Patient Education  2020 [employer] Inc[employer]  

## 2019-10-20 LAB — CBC WITH DIFFERENTIAL/PLATELET
Absolute Monocytes: 427 cells/uL (ref 200–950)
Basophils Absolute: 49 cells/uL (ref 0–200)
Basophils Relative: 0.8 %
Eosinophils Absolute: 348 cells/uL (ref 15–500)
Eosinophils Relative: 5.7 %
HCT: 39 % (ref 35.0–45.0)
Hemoglobin: 13.2 g/dL (ref 11.7–15.5)
Lymphs Abs: 1989 cells/uL (ref 850–3900)
MCH: 31.5 pg (ref 27.0–33.0)
MCHC: 33.8 g/dL (ref 32.0–36.0)
MCV: 93.1 fL (ref 80.0–100.0)
MPV: 10.8 fL (ref 7.5–12.5)
Monocytes Relative: 7 %
Neutro Abs: 3288 cells/uL (ref 1500–7800)
Neutrophils Relative %: 53.9 %
Platelets: 204 10*3/uL (ref 140–400)
RBC: 4.19 10*6/uL (ref 3.80–5.10)
RDW: 12 % (ref 11.0–15.0)
Total Lymphocyte: 32.6 %
WBC: 6.1 10*3/uL (ref 3.8–10.8)

## 2019-10-20 LAB — COMPREHENSIVE METABOLIC PANEL
AG Ratio: 1.6 (calc) (ref 1.0–2.5)
ALT: 6 U/L (ref 6–29)
AST: 11 U/L (ref 10–30)
Albumin: 4 g/dL (ref 3.6–5.1)
Alkaline phosphatase (APISO): 37 U/L (ref 31–125)
BUN: 10 mg/dL (ref 7–25)
CO2: 26 mmol/L (ref 20–32)
Calcium: 9.4 mg/dL (ref 8.6–10.2)
Chloride: 104 mmol/L (ref 98–110)
Creat: 0.78 mg/dL (ref 0.50–1.10)
Globulin: 2.5 g/dL (calc) (ref 1.9–3.7)
Glucose, Bld: 80 mg/dL (ref 65–99)
Potassium: 4 mmol/L (ref 3.5–5.3)
Sodium: 137 mmol/L (ref 135–146)
Total Bilirubin: 0.2 mg/dL (ref 0.2–1.2)
Total Protein: 6.5 g/dL (ref 6.1–8.1)

## 2019-10-20 LAB — LIPID PANEL
Cholesterol: 217 mg/dL — ABNORMAL HIGH (ref <200)
HDL: 77 mg/dL (ref >=50)
LDL Cholesterol (Calc): 115 mg/dL (calc) — ABNORMAL HIGH
Non-HDL Cholesterol (Calc): 140 mg/dL (calc) — ABNORMAL HIGH (ref <130)
Total CHOL/HDL Ratio: 2.8 (calc) (ref <5.0)
Triglycerides: 141 mg/dL (ref <150)

## 2019-12-14 ENCOUNTER — Other Ambulatory Visit: Payer: Self-pay | Admitting: Women's Health

## 2019-12-14 DIAGNOSIS — Z1231 Encounter for screening mammogram for malignant neoplasm of breast: Secondary | ICD-10-CM

## 2019-12-20 MED FILL — FEMYNOR 0.25-35 MG-MCG TABS: 0.25-35 | 84 days supply | Qty: 84 | Fill #0

## 2020-01-21 ENCOUNTER — Other Ambulatory Visit: Payer: Self-pay

## 2020-01-21 ENCOUNTER — Ambulatory Visit
Admission: RE | Admit: 2020-01-21 | Discharge: 2020-01-21 | Disposition: A | Discharge status: Home / Self Care | Payer: 59 | Source: Ambulatory Visit | Attending: Women's Health | Admitting: Women's Health

## 2020-01-21 DIAGNOSIS — Z1231 Encounter for screening mammogram for malignant neoplasm of breast: Secondary | ICD-10-CM | POA: Diagnosis not present

## 2020-03-08 MED FILL — FEMYNOR 0.25-35 MG-MCG TABS: 0.25-35 | 84 days supply | Qty: 84 | Fill #1

## 2020-06-01 MED FILL — VYLIBRA 0.25-35 MG-MCG TABS: 0.25-35 | 84 days supply | Qty: 84 | Fill #0

## 2020-08-25 MED FILL — VYLIBRA 0.25-35 MG-MCG TABS: 0.25-35 | 84 days supply | Qty: 84 | Fill #1

## 2020-10-24 ENCOUNTER — Other Ambulatory Visit: Payer: Self-pay

## 2020-10-24 ENCOUNTER — Other Ambulatory Visit: Payer: Self-pay | Admitting: Nurse Practitioner

## 2020-10-24 ENCOUNTER — Ambulatory Visit (INDEPENDENT_AMBULATORY_CARE_PROVIDER_SITE_OTHER): Payer: 59 | Admitting: Nurse Practitioner

## 2020-10-24 ENCOUNTER — Encounter: Payer: Self-pay | Admitting: Nurse Practitioner

## 2020-10-24 VITALS — BP 122/80 | Ht 65.0 in | Wt 143.0 lb

## 2020-10-24 DIAGNOSIS — Z13220 Encounter for screening for lipoid disorders: Secondary | ICD-10-CM

## 2020-10-24 DIAGNOSIS — Z01419 Encounter for gynecological examination (general) (routine) without abnormal findings: Secondary | ICD-10-CM

## 2020-10-24 DIAGNOSIS — Z3041 Encounter for surveillance of contraceptive pills: Secondary | ICD-10-CM

## 2020-10-24 MED ORDER — NORGESTIM-ETH ESTRAD TRIPHASIC 0.18/0.215/0.25 MG-35 MCG PO TABS: 1.0000 | ORAL_TABLET | Freq: Every day | ORAL | 3 refills | Status: DC

## 2020-10-24 MED FILL — TRI FEMYNOR 28 TABLET: 0.18/0.215/ | 84 days supply | Qty: 84 | Fill #0

## 2020-10-24 NOTE — Patient Instructions (Signed)
Health Maintenance, Female Adopting a healthy lifestyle and getting preventive care are important in promoting health and wellness. Ask your health care provider about:  The right schedule for you to have regular tests and exams.  Things you can do on your own to prevent diseases and keep yourself healthy. What should I know about diet, weight, and exercise? Eat a healthy diet   Eat a diet that includes plenty of vegetables, fruits, low-fat dairy products, and lean protein.  Do not eat a lot of foods that are high in solid fats, added sugars, or sodium. Maintain a healthy weight Body mass index (BMI) is used to identify weight problems. It estimates body fat based on height and weight. Your health care provider can help determine your BMI and help you achieve or maintain a healthy weight. Get regular exercise Get regular exercise. This is one of the most important things you can do for your health. Most adults should:  Exercise for at least 150 minutes each week. The exercise should increase your heart rate and make you sweat (moderate-intensity exercise).  Do strengthening exercises at least twice a week. This is in addition to the moderate-intensity exercise.  Spend less time sitting. Even light physical activity can be beneficial. Watch cholesterol and blood lipids Have your blood tested for lipids and cholesterol at 43 years of age, then have this test every 5 years. Have your cholesterol levels checked more often if:  Your lipid or cholesterol levels are high.  You are older than 43 years of age.  You are at high risk for heart disease. What should I know about cancer screening? Depending on your health history and family history, you may need to have cancer screening at various ages. This may include screening for:  Breast cancer.  Cervical cancer.  Colorectal cancer.  Skin cancer.  Lung cancer. What should I know about heart disease, diabetes, and high blood  pressure? Blood pressure and heart disease  High blood pressure causes heart disease and increases the risk of stroke. This is more likely to develop in people who have high blood pressure readings, are of African descent, or are overweight.  Have your blood pressure checked: ? Every 3-5 years if you are 18-39 years of age. ? Every year if you are 40 years old or older. Diabetes Have regular diabetes screenings. This checks your fasting blood sugar level. Have the screening done:  Once every three years after age 40 if you are at a normal weight and have a low risk for diabetes.  More often and at a younger age if you are overweight or have a high risk for diabetes. What should I know about preventing infection? Hepatitis B If you have a higher risk for hepatitis B, you should be screened for this virus. Talk with your health care provider to find out if you are at risk for hepatitis B infection. Hepatitis C Testing is recommended for:  Everyone born from 1945 through 1965.  Anyone with known risk factors for hepatitis C. Sexually transmitted infections (STIs)  Get screened for STIs, including gonorrhea and chlamydia, if: ? You are sexually active and are younger than 43 years of age. ? You are older than 43 years of age and your health care provider tells you that you are at risk for this type of infection. ? Your sexual activity has changed since you were last screened, and you are at increased risk for chlamydia or gonorrhea. Ask your health care provider if   you are at risk.  Ask your health care provider about whether you are at high risk for HIV. Your health care provider may recommend a prescription medicine to help prevent HIV infection. If you choose to take medicine to prevent HIV, you should first get tested for HIV. You should then be tested every 3 months for as long as you are taking the medicine. Pregnancy  If you are about to stop having your period (premenopausal) and  you may become pregnant, seek counseling before you get pregnant.  Take 400 to 800 micrograms (mcg) of folic acid every day if you become pregnant.  Ask for birth control (contraception) if you want to prevent pregnancy. Osteoporosis and menopause Osteoporosis is a disease in which the bones lose minerals and strength with aging. This can result in bone fractures. If you are 65 years old or older, or if you are at risk for osteoporosis and fractures, ask your health care provider if you should:  Be screened for bone loss.  Take a calcium or vitamin D supplement to lower your risk of fractures.  Be given hormone replacement therapy (HRT) to treat symptoms of menopause. Follow these instructions at home: Lifestyle  Do not use any products that contain nicotine or tobacco, such as cigarettes, e-cigarettes, and chewing tobacco. If you need help quitting, ask your health care provider.  Do not use street drugs.  Do not share needles.  Ask your health care provider for help if you need support or information about quitting drugs. Alcohol use  Do not drink alcohol if: ? Your health care provider tells you not to drink. ? You are pregnant, may be pregnant, or are planning to become pregnant.  If you drink alcohol: ? Limit how much you use to 0-1 drink a day. ? Limit intake if you are breastfeeding.  Be aware of how much alcohol is in your drink. In the [place] one drink equals one 12 oz bottle of beer (355 mL), one 5 oz glass of wine (148 mL), or one 1 oz glass of hard liquor (44 mL). General instructions  Schedule regular health, dental, and eye exams.  Stay current with your vaccines.  Tell your health care provider if: ? You often feel depressed. ? You have ever been abused or do not feel safe at home. Summary  Adopting a healthy lifestyle and getting preventive care are important in promoting health and wellness.  Follow your health care provider's instructions about healthy  diet, exercising, and getting tested or screened for diseases.  Follow your health care provider's instructions on monitoring your cholesterol and blood pressure. This information is not intended to replace advice given to you by your health care provider. Make sure you discuss any questions you have with your health care provider. Document Revised: 11/18/2018 Document Reviewed: 11/18/2018 Elsevier Patient Education  2020 Elsevier Inc.  

## 2020-10-24 NOTE — Addendum Note (Signed)
Addended by: Tito Dine on: 10/24/2020 04:35 PM   Modules accepted: Orders

## 2020-10-24 NOTE — Progress Notes (Signed)
   Kristin Esparza May 14, 1976 [id number]   History:  44 y.o. G0 presents for annual exam. Mostly monthly cycles but since switching from Sprintec to Ortho-Cyclen last year she has had BTB 1 week before cycles and heavy bleeding with clots during cycle. She would like to switch back to Sprintec as this worked good for her. 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 Normal mammogram history. Not currently sexually active.   Gynecologic History Patient's last menstrual period was 09/13/2020. Period Cycle (Days): 28 Period Duration (Days): 5 Period Pattern: Regular Menstrual Flow: Moderate Menstrual Control: Maxi pad, Tampon Dysmenorrhea: (!) Mild Dysmenorrhea Symptoms: Cramping Contraception: OCP (estrogen/progesterone) Last Pap: 10/08/2017. Results were: normal Last mammogram: 01/24/2020. Results were: normal  Past medical history, past surgical history, family history and social history were all reviewed and documented in the EPIC chart.  ROS:  A ROS was performed and pertinent positives and negatives are included.  Exam:  Vitals:   10/24/20 1538  BP: 122/80  Weight: 143 lb (64.9 kg)  Height: 5\' 5"  (1.651 m)   Body mass index is 23.8 kg/m.  General appearance:  Normal Thyroid:  Symmetrical, normal in size, without palpable masses or nodularity. Respiratory  Auscultation:  Clear without wheezing or rhonchi Cardiovascular  Auscultation:  Regular rate, without rubs, murmurs or gallops  Edema/varicosities:  Not grossly evident Abdominal  Soft,nontender, without masses, guarding or rebound.  Liver/spleen:  No organomegaly noted  Hernia:  None appreciated  Skin  Inspection:  Grossly normal   Breasts: Examined lying and sitting.   Right: Without masses, retractions, discharge or axillary adenopathy.   Left: Without masses, retractions, discharge or axillary adenopathy. Gentitourinary   Inguinal/mons:  Normal without inguinal adenopathy  External genitalia:   Normal  BUS/Urethra/Skene's glands:  Normal  Vagina:  Normal  Cervix:  Normal  Uterus:  Normal in size, shape and contour.  Midline and mobile  Adnexa/parametria:     Rt: Without masses or tenderness.   Lt: Without masses or tenderness.  Anus and perineum: Normal  Digital rectal exam: Normal sphincter tone without palpated masses or tenderness  Assessment/Plan:  44 y.o. G0 for annual exam.   Well woman exam with routine gynecological exam - Plan: CBC with Differential/Platelet, Comprehensive metabolic panel. Education provided on SBEs, importance of preventative screenings, current guidelines, high calcium diet, regular exercise, and multivitamin daily. Will return fasting for labs.   Encounter for surveillance of contraceptive pills - Plan: Norgestimate-Ethinyl Estradiol Triphasic (TRI-SPRINTEC) 0.18/0.215/0.25 MG-35 MCG tablet. Will switch back to Tri-Sprintec. Refill x 1 year provided.   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 Pap with reflex today.   Screening for breast cancer - Normal mammogram history. Continue annual screenings. Normal breast exam today.   Follow up in 1 year for annual.      2009 [facility], 4:01 PM 10/24/2020

## 2020-10-25 LAB — PAP IG W/ RFLX HPV ASCU

## 2020-12-18 ENCOUNTER — Other Ambulatory Visit: Payer: Self-pay | Admitting: Nurse Practitioner

## 2020-12-18 DIAGNOSIS — Z1231 Encounter for screening mammogram for malignant neoplasm of breast: Secondary | ICD-10-CM

## 2021-01-29 MED FILL — TRI FEMYNOR 28 TABLET: 0.18/0.215/ | 84 days supply | Qty: 84 | Fill #1

## 2021-01-30 ENCOUNTER — Ambulatory Visit
Admission: RE | Admit: 2021-01-30 | Discharge: 2021-01-30 | Disposition: A | Discharge status: Home / Self Care | Payer: 59 | Source: Ambulatory Visit | Attending: Nurse Practitioner | Admitting: Nurse Practitioner

## 2021-01-30 ENCOUNTER — Other Ambulatory Visit: Payer: Self-pay

## 2021-01-30 DIAGNOSIS — Z1231 Encounter for screening mammogram for malignant neoplasm of breast: Secondary | ICD-10-CM

## 2021-04-27 ENCOUNTER — Other Ambulatory Visit (HOSPITAL_COMMUNITY): Payer: Self-pay

## 2021-04-27 MED FILL — Norgestimate-Eth Estrad Tab 0.18-35/0.215-35/0.25-35 MG-MCG: ORAL | 84 days supply | Qty: 84 | Fill #0 | Status: AC

## 2021-04-30 ENCOUNTER — Other Ambulatory Visit (HOSPITAL_COMMUNITY): Payer: Self-pay

## 2021-07-23 ENCOUNTER — Other Ambulatory Visit (HOSPITAL_COMMUNITY): Payer: Self-pay

## 2021-07-23 MED FILL — Norgestimate-Eth Estrad Tab 0.18-35/0.215-35/0.25-35 MG-MCG: ORAL | 84 days supply | Qty: 84 | Fill #1 | Status: AC

## 2021-10-10 ENCOUNTER — Other Ambulatory Visit (HOSPITAL_COMMUNITY): Payer: Self-pay

## 2021-10-10 ENCOUNTER — Other Ambulatory Visit: Payer: Self-pay | Admitting: Nurse Practitioner

## 2021-10-10 DIAGNOSIS — Z3041 Encounter for surveillance of contraceptive pills: Secondary | ICD-10-CM

## 2021-10-10 MED ORDER — NORGESTIM-ETH ESTRAD TRIPHASIC 0.18/0.215/0.25 MG-35 MCG PO TABS
1.0000 | ORAL_TABLET | Freq: Every day | ORAL | 0 refills | Status: DC
  Filled 2021-10-10: qty 84, 84d supply, fill #0

## 2021-10-10 NOTE — Telephone Encounter (Signed)
Annual exam scheduled on 10/30/21

## 2021-10-30 ENCOUNTER — Other Ambulatory Visit (HOSPITAL_COMMUNITY): Payer: Self-pay

## 2021-10-30 ENCOUNTER — Other Ambulatory Visit: Payer: Self-pay

## 2021-10-30 ENCOUNTER — Ambulatory Visit (INDEPENDENT_AMBULATORY_CARE_PROVIDER_SITE_OTHER): Payer: 59 | Admitting: Nurse Practitioner

## 2021-10-30 ENCOUNTER — Encounter: Payer: Self-pay | Admitting: Nurse Practitioner

## 2021-10-30 VITALS — BP 120/76 | Ht 65.0 in | Wt 146.0 lb

## 2021-10-30 DIAGNOSIS — E785 Hyperlipidemia, unspecified: Secondary | ICD-10-CM | POA: Diagnosis not present

## 2021-10-30 DIAGNOSIS — Z3041 Encounter for surveillance of contraceptive pills: Secondary | ICD-10-CM

## 2021-10-30 DIAGNOSIS — Z01419 Encounter for gynecological examination (general) (routine) without abnormal findings: Secondary | ICD-10-CM | POA: Diagnosis not present

## 2021-10-30 MED ORDER — NORGESTIM-ETH ESTRAD TRIPHASIC 0.18/0.215/0.25 MG-35 MCG PO TABS
1.0000 | ORAL_TABLET | Freq: Every day | ORAL | 3 refills | Status: DC
  Filled 2021-10-30 – 2022-01-03 (×2): qty 84, 84d supply, fill #0
  Filled 2022-03-26: qty 84, 84d supply, fill #1
  Filled 2022-06-20: qty 84, 84d supply, fill #2
  Filled 2022-09-13 (×2): qty 84, 84d supply, fill #3

## 2021-10-30 NOTE — Progress Notes (Signed)
   Kristin Esparza February 09, 1976 [id number]   History:  45 y.o. G0 presents for annual exam. Monthly cycles on OCPs. 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 Normal mammogram history.   Gynecologic History Patient's last menstrual period was 10/09/2021. Period Cycle (Days): 28 Period Duration (Days): 7 Period Pattern: Regular Menstrual Flow: Heavy Dysmenorrhea: (!) Mild Dysmenorrhea Symptoms: Cramping Contraception/Family planning: OCP (estrogen/progesterone) Sexually active: No  Health Maintenance Last Pap: 10/24/2020. Results were: Normal, 3-year repeat Last mammogram: 01/30/2021. Results were: Normal Last colonoscopy: Not indicated Last Dexa: Not indicated  Past medical history, past surgical history, family history and social history were all reviewed and documented in the EPIC chart. 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   ROS:  A ROS was performed and pertinent positives and negatives are included.  Exam:  Vitals:   10/30/21 1601  BP: 120/76  Weight: 146 lb (66.2 kg)  Height: 5\' 5"  (1.651 m)    Body mass index is 24.3 kg/m.  General appearance:  Normal Thyroid:  Symmetrical, normal in size, without palpable masses or nodularity. Respiratory  Auscultation:  Clear without wheezing or rhonchi Cardiovascular  Auscultation:  Regular rate, without rubs, murmurs or gallops  Edema/varicosities:  Not grossly evident Abdominal  Soft,nontender, without masses, guarding or rebound.  Liver/spleen:  No organomegaly noted  Hernia:  None appreciated  Skin  Inspection:  Grossly normal   Breasts: Examined lying and sitting.   Right: Without masses, retractions, discharge or axillary adenopathy.   Left: Without masses, retractions, discharge or axillary adenopathy. Genitourinary   Inguinal/mons:  Normal without inguinal adenopathy  External genitalia:  Normal appearing vulva with no masses, tenderness, or lesions  BUS/Urethra/Skene's glands:  Normal  Vagina:  Normal appearing with  normal color and discharge, no lesions  Cervix:  Normal appearing without discharge or lesions  Uterus:  Normal in size, shape and contour.  Midline and mobile, nontender  Adnexa/parametria:     Rt: Normal in size, without masses or tenderness.   Lt: Normal in size, without masses or tenderness.  Anus and perineum: Normal  Digital rectal exam: Normal sphincter tone without palpated masses or tenderness  Patient informed chaperone available to be present for breast and pelvic exam. Patient has requested no chaperone to be present. Patient has been advised what will be completed during breast and pelvic exam.   Assessment/Plan:  45 y.o. G0 for annual exam.   Well woman exam with routine gynecological exam - Plan: CBC with Differential/Platelet, Comprehensive metabolic panel. Education provided on SBEs, importance of preventative screenings, current guidelines, high calcium diet, regular exercise, and multivitamin daily. Will return for fasting labs.   Encounter for surveillance of contraceptive pills - Plan: Norgestimate-Ethinyl Estradiol Triphasic (TRI-SPRINTEC) 0.18/0.215/0.25 MG-35 MCG tablet. Will switch back to Tri-Sprintec. Refill x 1 year provided.   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 Will repeat pap at 3-year interval per guidelines.   Screening for breast cancer - Normal mammogram history. Continue annual screenings. Normal breast exam today.   Follow up in 1 year for annual.      2009 [facility], 4:39 PM 10/30/2021

## 2021-12-27 ENCOUNTER — Other Ambulatory Visit (HOSPITAL_COMMUNITY): Payer: Self-pay

## 2021-12-27 MED ORDER — SODIUM FLUORIDE 1.1 % DT PSTE
PASTE | DENTAL | 2 refills | Status: AC
  Filled 2021-12-27: qty 100, 30d supply, fill #0

## 2021-12-28 ENCOUNTER — Other Ambulatory Visit: Payer: Self-pay | Admitting: Nurse Practitioner

## 2021-12-28 ENCOUNTER — Other Ambulatory Visit (HOSPITAL_COMMUNITY): Payer: Self-pay

## 2021-12-28 DIAGNOSIS — Z1231 Encounter for screening mammogram for malignant neoplasm of breast: Secondary | ICD-10-CM

## 2022-01-03 ENCOUNTER — Other Ambulatory Visit (HOSPITAL_COMMUNITY): Payer: Self-pay

## 2022-01-04 ENCOUNTER — Telehealth: Payer: 59 | Admitting: Nurse Practitioner

## 2022-01-04 ENCOUNTER — Other Ambulatory Visit (HOSPITAL_COMMUNITY): Payer: Self-pay

## 2022-01-04 DIAGNOSIS — R051 Acute cough: Secondary | ICD-10-CM | POA: Diagnosis not present

## 2022-01-04 MED ORDER — AZITHROMYCIN 250 MG PO TABS
ORAL_TABLET | ORAL | 0 refills | Status: DC
  Filled 2022-01-04: qty 6, 5d supply, fill #0

## 2022-01-04 MED ORDER — BENZONATATE 100 MG PO CAPS
100.0000 mg | ORAL_CAPSULE | Freq: Three times a day (TID) | ORAL | 0 refills | Status: DC | PRN
  Filled 2022-01-04: qty 20, 7d supply, fill #0

## 2022-01-04 NOTE — Progress Notes (Signed)
We are sorry that you are not feeling well.  Here is how we plan to help! ? ?Based on your presentation I believe you most likely have A cough due to bacteria.  When patients have a fever and a productive cough with a change in color or increased sputum production, we are concerned about bacterial bronchitis.  If left untreated it can progress to pneumonia.  If your symptoms do not improve with your treatment plan it is important that you contact your provider.   I have prescribed Azithromyin 250 mg: two tablets now and then one tablet daily for 4 additonal days  ?  ?In addition you may use A prescription cough medication called Tessalon Perles 100mg. You may take 1-2 capsules every 8 hours as needed for your cough. ? ? ?From your responses in the eVisit questionnaire you describe inflammation in the upper respiratory tract which is causing a significant cough.  This is commonly called Bronchitis and has four common causes:   ?Allergies ?Viral Infections ?Acid Reflux ?Bacterial Infection ?Allergies, viruses and acid reflux are treated by controlling symptoms or eliminating the cause. An example might be a cough caused by taking certain blood pressure medications. You stop the cough by changing the medication. Another example might be a cough caused by acid reflux. Controlling the reflux helps control the cough. ? ?USE OF BRONCHODILATOR ("RESCUE") INHALERS: ?There is a risk from using your bronchodilator too frequently.  The risk is that over-reliance on a medication which only relaxes the muscles surrounding the breathing tubes can reduce the effectiveness of medications prescribed to reduce swelling and congestion of the tubes themselves.  Although you feel brief relief from the bronchodilator inhaler, your asthma may actually be worsening with the tubes becoming more swollen and filled with mucus.  This can delay other crucial treatments, such as oral steroid medications. If you need to use a bronchodilator  inhaler daily, several times per day, you should discuss this with your provider.  There are probably better treatments that could be used to keep your asthma under control.  ?   ?HOME CARE ?Only take medications as instructed by your medical team. ?Complete the entire course of an antibiotic. ?Drink plenty of fluids and get plenty of rest. ?Avoid close contacts especially the very young and the elderly ?Cover your mouth if you cough or cough into your sleeve. ?Always remember to wash your hands ?A steam or ultrasonic humidifier can help congestion.  ? ?GET HELP RIGHT AWAY IF: ?You develop worsening fever. ?You become short of breath ?You cough up blood. ?Your symptoms persist after you have completed your treatment plan ?MAKE SURE YOU  ?Understand these instructions. ?Will watch your condition. ?Will get help right away if you are not doing well or get worse. ?  ? ?Thank you for choosing an e-visit. ? ?Your e-visit answers were reviewed by a board certified advanced clinical practitioner to complete your personal care plan. Depending upon the condition, your plan could have included both over the counter or prescription medications. ? ?Please review your pharmacy choice. Make sure the pharmacy is open so you can pick up prescription now. If there is a problem, you may contact your provider through MyChart messaging and have the prescription routed to another pharmacy.  Your safety is important to us. If you have drug allergies check your prescription carefully.  ? ?For the next 24 hours you can use MyChart to ask questions about today's visit, request a non-urgent call back, or ask   for a work or school excuse. ?You will get an email in the next two days asking about your experience. I hope that your e-visit has been valuable and will speed your recovery. ? ?5-10 minutes spent reviewing and documenting in chart. ? ?

## 2022-01-31 ENCOUNTER — Ambulatory Visit: Payer: 59

## 2022-02-07 ENCOUNTER — Ambulatory Visit
Admission: RE | Admit: 2022-02-07 | Discharge: 2022-02-07 | Disposition: A | Discharge status: Home / Self Care | Payer: 59 | Source: Ambulatory Visit | Attending: Nurse Practitioner | Admitting: Nurse Practitioner

## 2022-02-07 DIAGNOSIS — Z1231 Encounter for screening mammogram for malignant neoplasm of breast: Secondary | ICD-10-CM

## 2022-02-14 ENCOUNTER — Other Ambulatory Visit: Payer: 59

## 2022-02-14 ENCOUNTER — Other Ambulatory Visit: Payer: Self-pay

## 2022-02-14 DIAGNOSIS — E785 Hyperlipidemia, unspecified: Secondary | ICD-10-CM

## 2022-02-14 DIAGNOSIS — Z01419 Encounter for gynecological examination (general) (routine) without abnormal findings: Secondary | ICD-10-CM | POA: Diagnosis not present

## 2022-02-15 LAB — CBC WITH DIFFERENTIAL/PLATELET
Absolute Monocytes: 358 cells/uL (ref 200–950)
Basophils Absolute: 39 cells/uL (ref 0–200)
Basophils Relative: 0.6 %
Eosinophils Absolute: 208 cells/uL (ref 15–500)
Eosinophils Relative: 3.2 %
HCT: 40 % (ref 35.0–45.0)
Hemoglobin: 12.9 g/dL (ref 11.7–15.5)
Lymphs Abs: 1625 cells/uL (ref 850–3900)
MCH: 29 pg (ref 27.0–33.0)
MCHC: 32.3 g/dL (ref 32.0–36.0)
MCV: 89.9 fL (ref 80.0–100.0)
MPV: 10.8 fL (ref 7.5–12.5)
Monocytes Relative: 5.5 %
Neutro Abs: 4271 cells/uL (ref 1500–7800)
Neutrophils Relative %: 65.7 %
Platelets: 213 10*3/uL (ref 140–400)
RBC: 4.45 10*6/uL (ref 3.80–5.10)
RDW: 12.8 % (ref 11.0–15.0)
Total Lymphocyte: 25 %
WBC: 6.5 10*3/uL (ref 3.8–10.8)

## 2022-02-15 LAB — COMPREHENSIVE METABOLIC PANEL
AG Ratio: 1.5 (calc) (ref 1.0–2.5)
ALT: 10 U/L (ref 6–29)
AST: 14 U/L (ref 10–30)
Albumin: 4.3 g/dL (ref 3.6–5.1)
Alkaline phosphatase (APISO): 41 U/L (ref 31–125)
BUN: 14 mg/dL (ref 7–25)
CO2: 25 mmol/L (ref 20–32)
Calcium: 9.6 mg/dL (ref 8.6–10.2)
Chloride: 107 mmol/L (ref 98–110)
Creat: 0.91 mg/dL (ref 0.50–0.99)
Globulin: 2.8 g/dL (calc) (ref 1.9–3.7)
Glucose, Bld: 86 mg/dL (ref 65–99)
Potassium: 4.8 mmol/L (ref 3.5–5.3)
Sodium: 142 mmol/L (ref 135–146)
Total Bilirubin: 0.5 mg/dL (ref 0.2–1.2)
Total Protein: 7.1 g/dL (ref 6.1–8.1)

## 2022-02-15 LAB — LIPID PANEL
Cholesterol: 221 mg/dL — ABNORMAL HIGH (ref <200)
HDL: 96 mg/dL (ref >=50)
LDL Cholesterol (Calc): 105 mg/dL (calc) — ABNORMAL HIGH
Non-HDL Cholesterol (Calc): 125 mg/dL (calc) (ref <130)
Total CHOL/HDL Ratio: 2.3 (calc) (ref <5.0)
Triglycerides: 102 mg/dL (ref <150)

## 2022-02-15 LAB — TSH: TSH: 0.99 mIU/L

## 2022-03-26 ENCOUNTER — Other Ambulatory Visit (HOSPITAL_COMMUNITY): Payer: Self-pay

## 2022-06-20 ENCOUNTER — Other Ambulatory Visit (HOSPITAL_COMMUNITY): Payer: Self-pay

## 2022-09-13 ENCOUNTER — Other Ambulatory Visit (HOSPITAL_COMMUNITY): Payer: Self-pay

## 2022-09-29 IMAGING — MG MM DIGITAL SCREENING BILAT W/ TOMO AND CAD
8 series · 9 of 24 positions shown · non-contrast
Comparison: Previous exam(s).

CLINICAL DATA: Screening.

EXAM:
DIGITAL SCREENING BILATERAL MAMMOGRAM WITH TOMOSYNTHESIS AND CAD
TECHNIQUE: Bilateral screening digital craniocaudal and mediolateral oblique
mammograms were obtained. Bilateral screening digital breast
tomosynthesis was performed. The images were evaluated with
computer-aided detection.

[L MLO synth-2D]
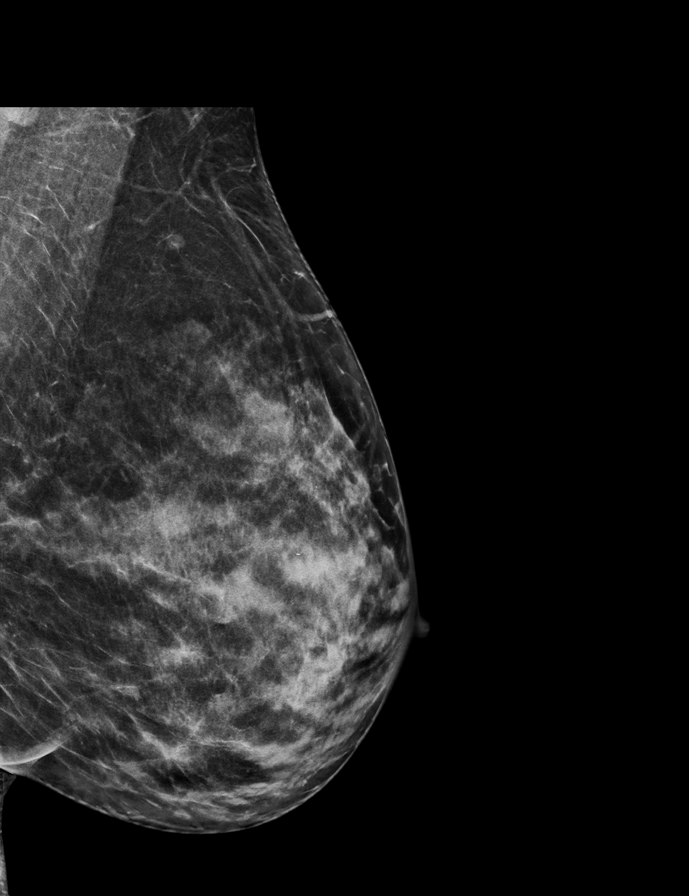

[R MLO synth-2D]
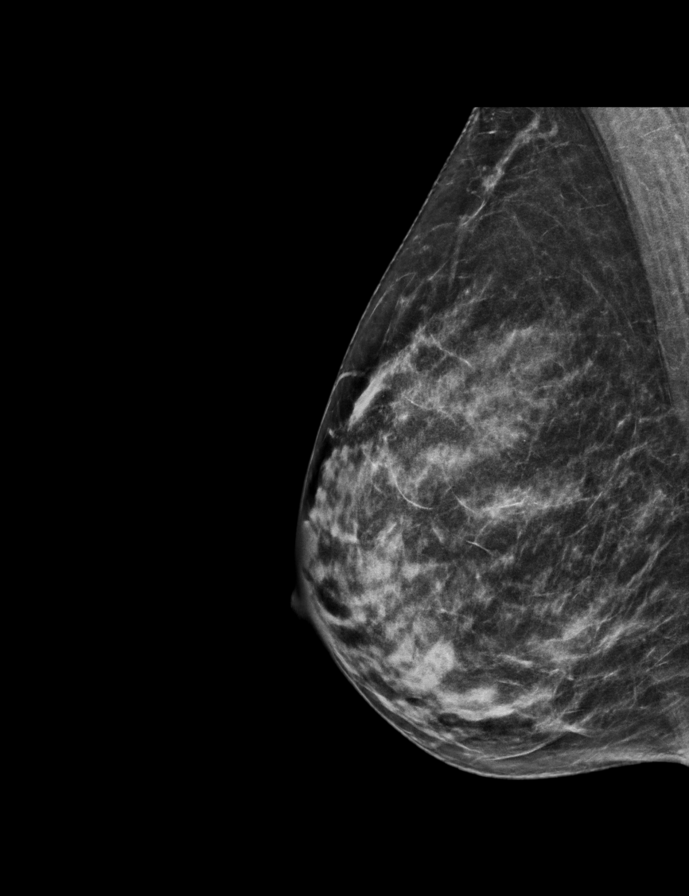

[R CC synth-2D]
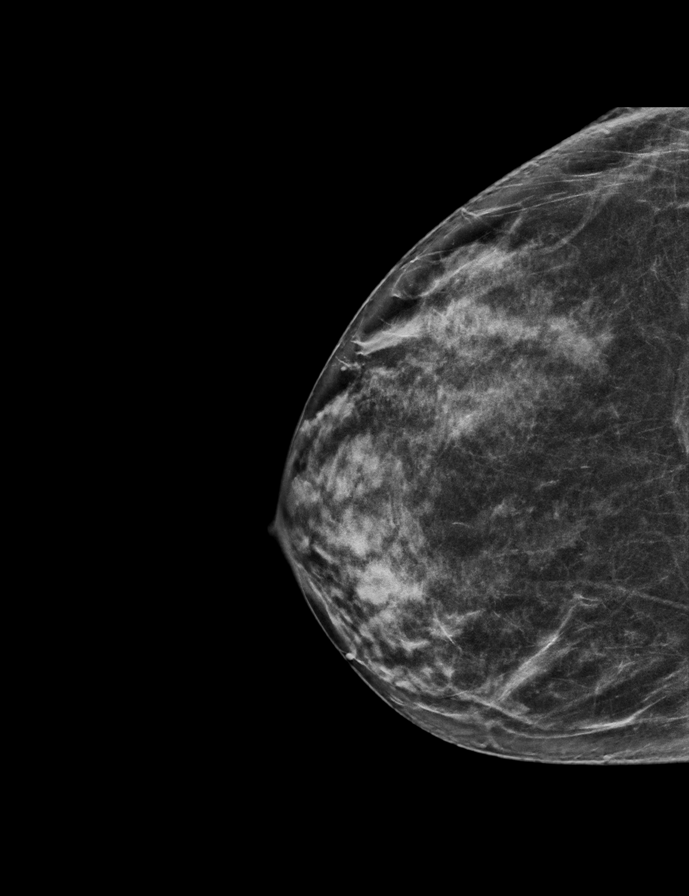

[L CC synth-2D]
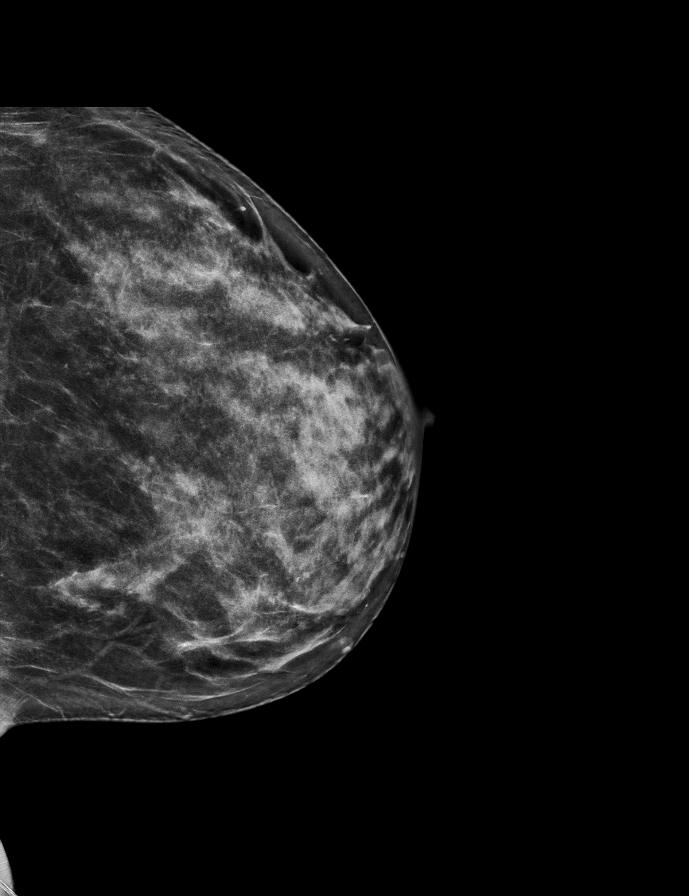

[L MLO tomo · 2 of 63 frames shown]
[frame 21/63]
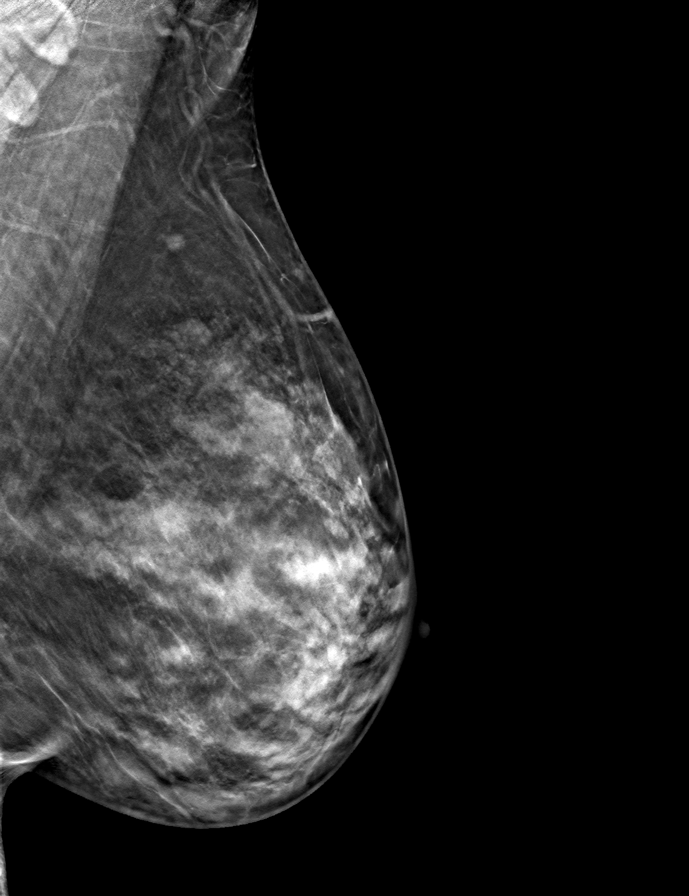
[frame 32/63]
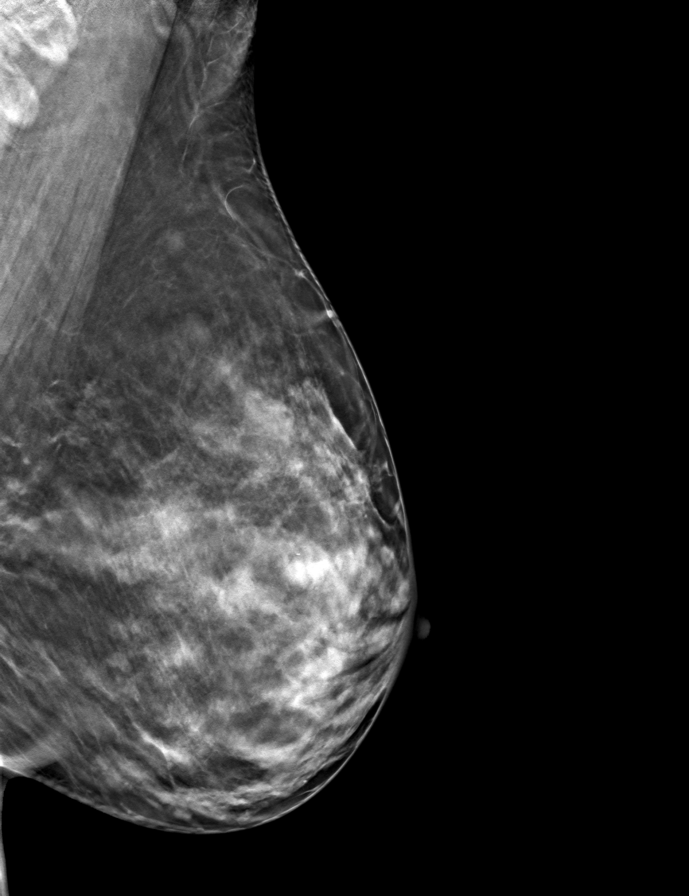

[L CC tomo · tomo slice 33/64.0]
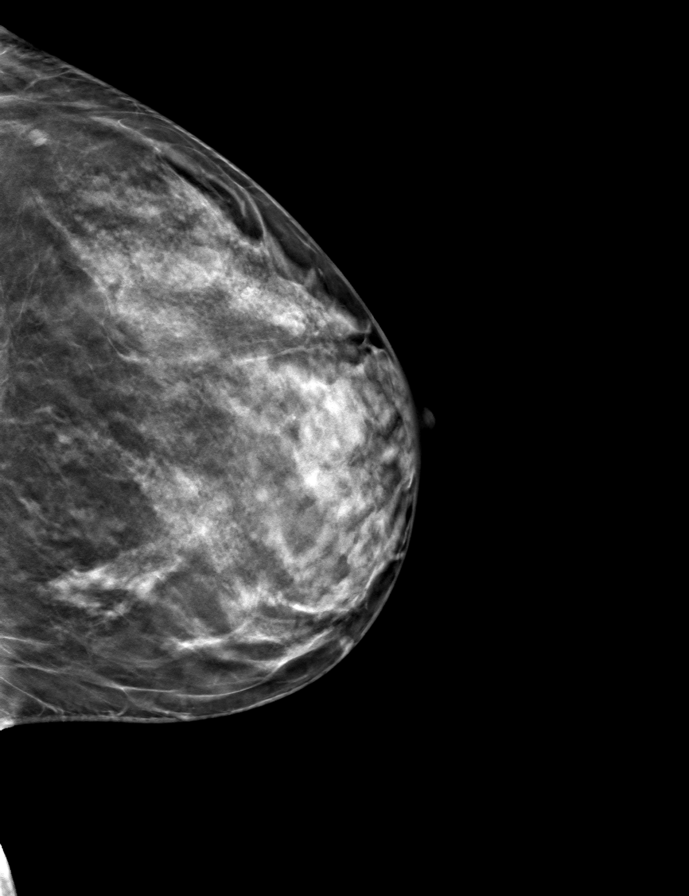

[R MLO tomo · tomo slice 29/58.0]
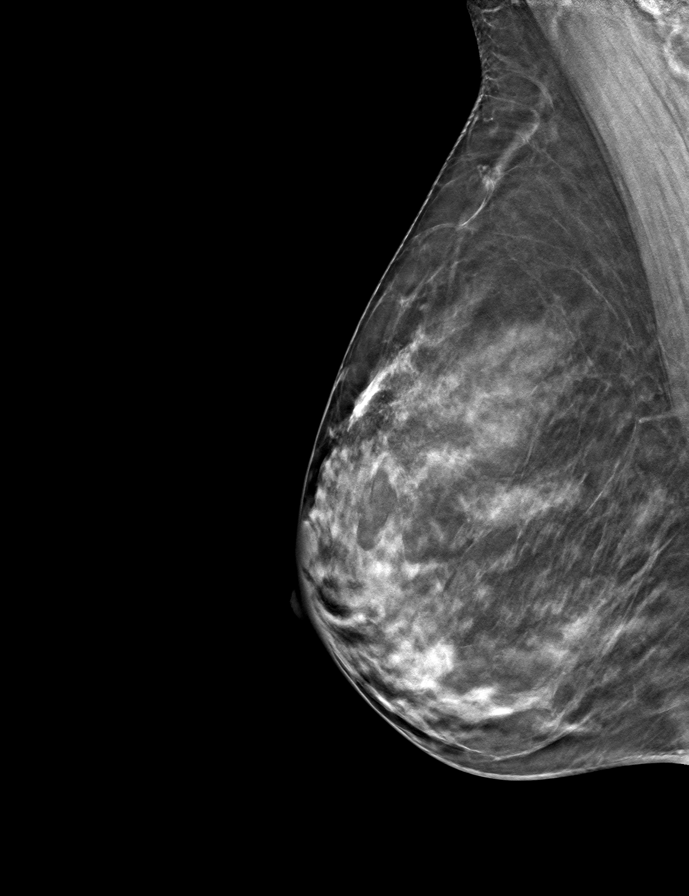

[R CC tomo · tomo slice 31/61.0]
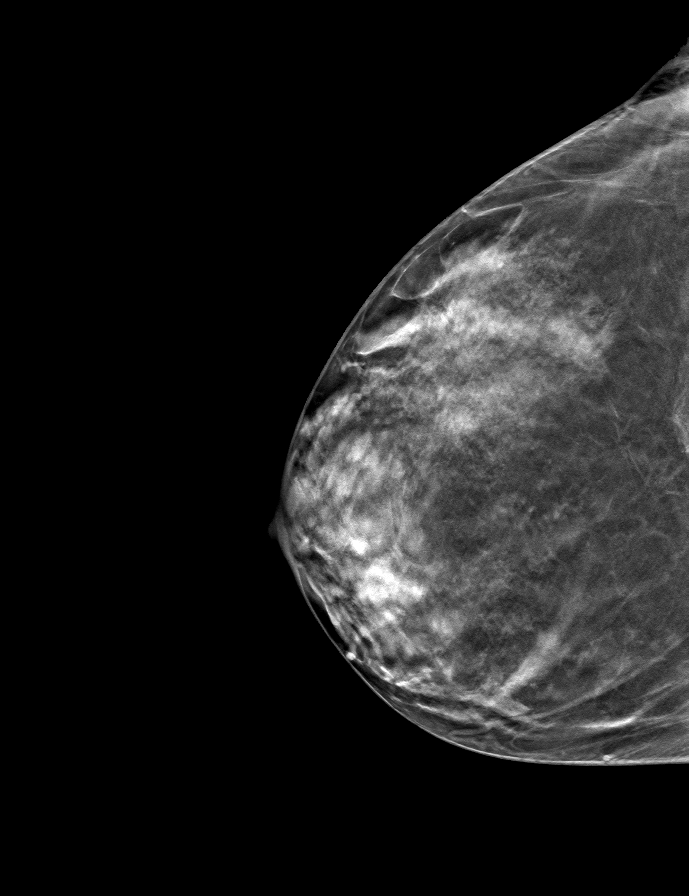

[9 of 24 positions shown; findings below may reference images not displayed]

ACR Breast Density Category c: The breast tissue is heterogeneously
dense, which may obscure small masses.
FINDINGS: There are no findings suspicious for malignancy.
IMPRESSION: No mammographic evidence of malignancy. A result letter of this
screening mammogram will be mailed directly to the patient.

RECOMMENDATION:
Screening mammogram in one year. (Code:Q3-W-BC3)

BI-RADS CATEGORY  1: Negative.

## 2022-11-07 ENCOUNTER — Ambulatory Visit: Payer: 59 | Admitting: Nurse Practitioner

## 2022-11-11 ENCOUNTER — Encounter: Payer: Self-pay | Admitting: Nurse Practitioner

## 2022-11-14 ENCOUNTER — Other Ambulatory Visit (HOSPITAL_COMMUNITY): Payer: Self-pay

## 2022-11-14 ENCOUNTER — Ambulatory Visit (INDEPENDENT_AMBULATORY_CARE_PROVIDER_SITE_OTHER): Payer: 59 | Admitting: Nurse Practitioner

## 2022-11-14 ENCOUNTER — Encounter: Payer: Self-pay | Admitting: Nurse Practitioner

## 2022-11-14 VITALS — BP 112/72 | HR 64 | Ht 64.75 in | Wt 154.0 lb

## 2022-11-14 DIAGNOSIS — Z01419 Encounter for gynecological examination (general) (routine) without abnormal findings: Secondary | ICD-10-CM | POA: Diagnosis not present

## 2022-11-14 DIAGNOSIS — E785 Hyperlipidemia, unspecified: Secondary | ICD-10-CM | POA: Diagnosis not present

## 2022-11-14 DIAGNOSIS — Z3041 Encounter for surveillance of contraceptive pills: Secondary | ICD-10-CM | POA: Diagnosis not present

## 2022-11-14 MED ORDER — NORGESTIM-ETH ESTRAD TRIPHASIC 0.18/0.215/0.25 MG-35 MCG PO TABS
1.0000 | ORAL_TABLET | Freq: Every day | ORAL | 3 refills | Status: DC
  Filled 2022-11-14 – 2022-12-03 (×2): qty 84, 84d supply, fill #0
  Filled 2023-02-24: qty 84, 84d supply, fill #1
  Filled 2023-05-22: qty 84, 84d supply, fill #2
  Filled 2023-08-14: qty 84, 84d supply, fill #3

## 2022-11-14 NOTE — Patient Instructions (Signed)
Schedule Colonoscopy! ?[facility] ?[phone] ?[address] [place], [place] [address] ? ?

## 2022-11-14 NOTE — Progress Notes (Signed)
   Florice Hindle Jul 10, 1976 [id number]   History:  46 y.o. G0 presents for annual exam. Monthly cycles on OCPs.  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 Normal mammogram history.   Gynecologic History Patient's last menstrual period was 11/04/2022 (exact date). Period Cycle (Days):  (28) Period Duration (Days): -5-6 Menstrual Flow:  (mod to heavy) Menstrual Control: Tampon, Maxi pad Dysmenorrhea: (!) Moderate Dysmenorrhea Symptoms: Cramping, Nausea, Headache Contraception/Family planning: OCP (estrogen/progesterone) Sexually active: No  Health Maintenance Last Pap: 10/24/2020. Results were: Normal, 3-year repeat Last mammogram: 02/07/2022. Results were: Normal Last colonoscopy: Never Last Dexa: Not indicated  Past medical history, past surgical history, family history and social history were all reviewed and documented in the EPIC chart. 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   ROS:  A ROS was performed and pertinent positives and negatives are included.  Exam:  Vitals:   11/14/22 1416  BP: 112/72  Pulse: 64  SpO2: 98%  Weight: 154 lb (69.9 kg)  Height: 5' 4.75" (1.645 m)     Body mass index is 25.83 kg/m.  General appearance:  Normal Thyroid:  Symmetrical, normal in size, without palpable masses or nodularity. Respiratory  Auscultation:  Clear without wheezing or rhonchi Cardiovascular  Auscultation:  Regular rate, without rubs, murmurs or gallops  Edema/varicosities:  Not grossly evident Abdominal  Soft,nontender, without masses, guarding or rebound.  Liver/spleen:  No organomegaly noted  Hernia:  None appreciated  Skin  Inspection:  Grossly normal   Breasts: Examined lying and sitting.   Right: Without masses, retractions, discharge or axillary adenopathy.   Left: Without masses, retractions, discharge or axillary adenopathy. Genitourinary   Inguinal/mons:  Normal without inguinal adenopathy  External genitalia:  Normal appearing vulva with no masses,  tenderness, or lesions  BUS/Urethra/Skene's glands:  Normal  Vagina:  Normal appearing with normal color and discharge, no lesions  Cervix:  Normal appearing without discharge or lesions  Uterus:  Normal in size, shape and contour.  Midline and mobile, nontender  Adnexa/parametria:     Rt: Normal in size, without masses or tenderness.   Lt: Normal in size, without masses or tenderness.  Anus and perineum: Normal  Digital rectal exam: Not indicated  Patient informed chaperone available to be present for breast and pelvic exam. Patient has requested no chaperone to be present. Patient has been advised what will be completed during breast and pelvic exam.   Assessment/Plan:  46 y.o. G0 for annual exam.   Well woman exam with routine gynecological exam - Plan: CBC with Differential/Platelet, Comprehensive metabolic panel. Education provided on SBEs, importance of preventative screenings, current guidelines, high calcium diet, regular exercise, and multivitamin daily. Will return for fasting labs in March.   Encounter for surveillance of contraceptive pills - Plan: Norgestimate-Ethinyl Estradiol Triphasic (TRI-SPRINTEC) 0.18/0.215/0.25 MG-35 MCG tablet. Will switch back to Tri-Sprintec. Refill x 1 year provided.   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 Will repeat pap at 3-year interval per guidelines.   Screening for breast cancer - Normal mammogram history. Continue annual screenings. Normal breast exam today.   Screening for colon cancer - Discussed current guidelines and importance of preventative screenings. Cologuard versus Colonoscopy discussed. Information provided on [facility] and she plans to schedule soon.  Follow up in 1 year for annual.      [name] [facility], 2:38 PM 11/14/2022

## 2022-12-03 ENCOUNTER — Other Ambulatory Visit (HOSPITAL_COMMUNITY): Payer: Self-pay

## 2023-01-02 ENCOUNTER — Other Ambulatory Visit: Payer: Self-pay | Admitting: Nurse Practitioner

## 2023-01-02 DIAGNOSIS — Z1211 Encounter for screening for malignant neoplasm of colon: Secondary | ICD-10-CM

## 2023-01-17 ENCOUNTER — Other Ambulatory Visit: Payer: Self-pay | Admitting: Nurse Practitioner

## 2023-01-17 ENCOUNTER — Telehealth: Payer: Self-pay

## 2023-01-17 DIAGNOSIS — Z1231 Encounter for screening mammogram for malignant neoplasm of breast: Secondary | ICD-10-CM

## 2023-01-17 NOTE — Telephone Encounter (Signed)
Pt calling to report that she desires to have her colonoscopy done at Dr. Lorie Apley office at [place] versus the referral we had sent for her to [place].  Tried to call pt back to advise her that if it is just for a screening and not diagnostic, then she shouldn't need a referral. However, no answer, and mailbox is full. Will try again later.

## 2023-01-29 NOTE — Telephone Encounter (Signed)
LDVM on machine per DPR that pt should just be able to call office to schedule initial consult for screening colonoscopy. If they advise her that a referral is needed, then she can call us back and we will send.

## 2023-02-24 ENCOUNTER — Other Ambulatory Visit (HOSPITAL_COMMUNITY): Payer: Self-pay

## 2023-02-28 ENCOUNTER — Ambulatory Visit: Payer: Commercial Managed Care - PPO

## 2023-03-06 NOTE — Telephone Encounter (Signed)
Pt has not returned call. Will route to provider for final review and close encounter.

## 2023-04-24 ENCOUNTER — Ambulatory Visit: Payer: Commercial Managed Care - PPO

## 2023-05-01 ENCOUNTER — Ambulatory Visit
Admission: RE | Admit: 2023-05-01 | Discharge: 2023-05-01 | Disposition: A | Discharge status: Home / Self Care | Payer: 59 | Source: Ambulatory Visit | Attending: Nurse Practitioner | Admitting: Nurse Practitioner

## 2023-05-01 DIAGNOSIS — Z1231 Encounter for screening mammogram for malignant neoplasm of breast: Secondary | ICD-10-CM

## 2023-08-14 ENCOUNTER — Other Ambulatory Visit (HOSPITAL_COMMUNITY): Payer: Self-pay

## 2023-10-21 ENCOUNTER — Other Ambulatory Visit (HOSPITAL_COMMUNITY): Payer: Self-pay

## 2023-10-21 MED ORDER — SODIUM FLUORIDE 1.1 % DT CREA
TOPICAL_CREAM | DENTAL | 2 refills | Status: AC
  Filled 2023-10-21: qty 51, 30d supply, fill #0

## 2023-10-31 ENCOUNTER — Other Ambulatory Visit (HOSPITAL_COMMUNITY): Payer: Self-pay

## 2023-11-01 ENCOUNTER — Other Ambulatory Visit: Payer: Self-pay | Admitting: Nurse Practitioner

## 2023-11-01 DIAGNOSIS — Z3041 Encounter for surveillance of contraceptive pills: Secondary | ICD-10-CM

## 2023-11-03 ENCOUNTER — Other Ambulatory Visit (HOSPITAL_COMMUNITY): Payer: Self-pay

## 2023-11-03 MED ORDER — NORGESTIM-ETH ESTRAD TRIPHASIC 0.18/0.215/0.25 MG-35 MCG PO TABS
1.0000 | ORAL_TABLET | Freq: Every day | ORAL | 1 refills | Status: DC
Start: 2023-11-03 — End: 2024-02-19
  Filled 2023-11-03: qty 84, 84d supply, fill #0
  Filled 2023-12-30 – 2024-01-25 (×2): qty 84, 84d supply, fill #1

## 2023-11-03 NOTE — Telephone Encounter (Signed)
Medication refill request: tri-vylibra 0.18/0.215/0.25 mg-4mcg Last AEX:  11-14-22 Next AEX: 02-19-24 Last MMG (if hormonal medication request): 05-01-23 birads 1:neg Refill authorized: please approve if appropriate

## 2023-12-30 ENCOUNTER — Telehealth: Payer: 59 | Admitting: Physician Assistant

## 2023-12-30 ENCOUNTER — Other Ambulatory Visit (HOSPITAL_COMMUNITY): Payer: Self-pay

## 2023-12-30 DIAGNOSIS — B9689 Other specified bacterial agents as the cause of diseases classified elsewhere: Secondary | ICD-10-CM | POA: Diagnosis not present

## 2023-12-30 DIAGNOSIS — J019 Acute sinusitis, unspecified: Secondary | ICD-10-CM

## 2023-12-30 MED ORDER — DOXYCYCLINE HYCLATE 100 MG PO TABS
100.0000 mg | ORAL_TABLET | Freq: Two times a day (BID) | ORAL | 0 refills | Status: DC
Start: 2023-12-30 — End: 2024-07-31
  Filled 2023-12-30: qty 20, 10d supply, fill #0

## 2023-12-30 NOTE — Progress Notes (Signed)
I have spent 5 minutes in review of e-visit questionnaire, review and updating patient chart, medical decision making and response to patient.   Mia Milan Cody Jacklynn Dehaas, PA-C    

## 2023-12-30 NOTE — Progress Notes (Signed)

## 2024-01-15 ENCOUNTER — Telehealth: Payer: 59 | Admitting: Family Medicine

## 2024-01-15 ENCOUNTER — Other Ambulatory Visit (HOSPITAL_COMMUNITY): Payer: Self-pay

## 2024-01-15 DIAGNOSIS — K047 Periapical abscess without sinus: Secondary | ICD-10-CM

## 2024-01-15 MED ORDER — CLINDAMYCIN HCL 300 MG PO CAPS
300.0000 mg | ORAL_CAPSULE | Freq: Three times a day (TID) | ORAL | 0 refills | Status: AC
Start: 2024-01-15 — End: 2024-01-22
  Filled 2024-01-15: qty 21, 7d supply, fill #0

## 2024-01-15 NOTE — Progress Notes (Signed)
 E-Visit for Dental Pain  We are sorry that you are not feeling well.  Here is how we plan to help!  Clindamycin  300mg  3 times a day for 7 days  It is imperative that you see a dentist within 10 days of this eVisit to determine the cause of the dental pain and be sure it is adequately treated  A toothache or tooth pain is caused when the nerve in the root of a tooth or surrounding a tooth is irritated. Dental (tooth) infection, decay, injury, or loss of a tooth are the most common causes of dental pain. Pain may also occur after an extraction (tooth is pulled out). Pain sometimes originates from other areas and radiates to the jaw, thus appearing to be tooth pain.Bacteria growing inside your mouth can contribute to gum disease and dental decay, both of which can cause pain. A toothache occurs from inflammation of the central portion of the tooth called pulp. The pulp contains nerve endings that are very sensitive to pain. Inflammation to the pulp or pulpitis may be caused by dental cavities, trauma, and infection.    HOME CARE:   For toothaches: Over-the-counter pain medications such as acetaminophen  or ibuprofen  may be used. Take these as directed on the package while you arrange for a dental appointment. Avoid very cold or hot foods, because they may make the pain worse. You may get relief from biting on a cotton ball soaked in oil of cloves. You can get oil of cloves at most drug stores.  For jaw pain:  Aspirin may be helpful for problems in the joint of the jaw in adults. If pain happens every time you open your mouth widely, the temporomandibular joint (TMJ) may be the source of the pain. Yawning or taking a large bite of food may worsen the pain. An appointment with your doctor or dentist will help you find the cause.     GET HELP RIGHT AWAY IF:  You have a high fever or chills If you have had a recent head or face injury and develop headache, light headedness, nausea, vomiting, or  other symptoms that concern you after an injury to your face or mouth, you could have a more serious injury in addition to your dental injury. A facial rash associated with a toothache: This condition may improve with medication. Contact your doctor for them to decide what is appropriate. Any jaw pain occurring with chest pain: Although jaw pain is most commonly caused by dental disease, it is sometimes referred pain from other areas. People with heart disease, especially people who have had stents placed, people with diabetes, or those who have had heart surgery may have jaw pain as a symptom of heart attack or angina. If your jaw or tooth pain is associated with lightheadedness, sweating, or shortness of breath, you should see a doctor as soon as possible. Trouble swallowing or excessive pain or bleeding from gums: If you have a history of a weakened immune system, diabetes, or steroid use, you may be more susceptible to infections. Infections can often be more severe and extensive or caused by unusual organisms. Dental and gum infections in people with these conditions may require more aggressive treatment. An abscess may need draining or IV antibiotics, for example.  MAKE SURE YOU   Understand these instructions. Will watch your condition. Will get help right away if you are not doing well or get worse.  Thank you for choosing an e-visit.  Your e-visit answers were reviewed  by a board certified advanced clinical practitioner to complete your personal care plan. Depending upon the condition, your plan could have included both over the counter or prescription medications.  Please review your pharmacy choice. Make sure the pharmacy is open so you can pick up prescription now. If there is a problem, you may contact your provider through [employer] and have the prescription routed to another pharmacy.  Your safety is important to us . If you have drug allergies check your prescription carefully.    For the next 24 hours you can use MyChart to ask questions about today's visit, request a non-urgent call back, or ask for a work or school excuse. You will get an email in the next two days asking about your experience. I hope that your e-visit has been valuable and will speed your recovery.  I provided 5 minutes of non face-to-face time during this encounter for chart review, medication and order placement, as well as and documentation.

## 2024-02-19 ENCOUNTER — Other Ambulatory Visit (HOSPITAL_COMMUNITY)
Admission: RE | Admit: 2024-02-19 | Discharge: 2024-02-19 | Disposition: A | Discharge status: Home / Self Care | Source: Ambulatory Visit | Attending: Nurse Practitioner | Admitting: Nurse Practitioner

## 2024-02-19 ENCOUNTER — Encounter: Payer: Self-pay | Admitting: Nurse Practitioner

## 2024-02-19 ENCOUNTER — Other Ambulatory Visit (HOSPITAL_COMMUNITY): Payer: Self-pay

## 2024-02-19 ENCOUNTER — Ambulatory Visit (INDEPENDENT_AMBULATORY_CARE_PROVIDER_SITE_OTHER): Payer: Self-pay | Admitting: Nurse Practitioner

## 2024-02-19 VITALS — BP 126/72 | HR 86 | Ht 64.75 in | Wt 152.0 lb

## 2024-02-19 DIAGNOSIS — Z01419 Encounter for gynecological examination (general) (routine) without abnormal findings: Secondary | ICD-10-CM

## 2024-02-19 DIAGNOSIS — Z3041 Encounter for surveillance of contraceptive pills: Secondary | ICD-10-CM | POA: Diagnosis not present

## 2024-02-19 DIAGNOSIS — Z1211 Encounter for screening for malignant neoplasm of colon: Secondary | ICD-10-CM

## 2024-02-19 DIAGNOSIS — T753XXD Motion sickness, subsequent encounter: Secondary | ICD-10-CM

## 2024-02-19 DIAGNOSIS — E785 Hyperlipidemia, unspecified: Secondary | ICD-10-CM | POA: Diagnosis not present

## 2024-02-19 DIAGNOSIS — Z124 Encounter for screening for malignant neoplasm of cervix: Secondary | ICD-10-CM | POA: Diagnosis not present

## 2024-02-19 MED ORDER — NORGESTIM-ETH ESTRAD TRIPHASIC 0.18/0.215/0.25 MG-35 MCG PO TABS
1.0000 | ORAL_TABLET | Freq: Every day | ORAL | 4 refills | Status: AC
  Filled 2024-02-19 – 2024-04-20 (×2): qty 84, 84d supply, fill #0
  Filled 2024-07-08: qty 84, 84d supply, fill #1
  Filled 2024-09-30: qty 84, 84d supply, fill #2
  Filled 2024-12-23: qty 84, 84d supply, fill #3

## 2024-02-19 MED ORDER — ONDANSETRON HCL 4 MG PO TABS
4.0000 mg | ORAL_TABLET | Freq: Two times a day (BID) | ORAL | 0 refills | Status: AC
  Filled 2024-02-19: qty 20, 10d supply, fill #0

## 2024-02-19 MED ORDER — SCOPOLAMINE 1 MG/3DAYS TD PT72
1.0000 | MEDICATED_PATCH | TRANSDERMAL | 0 refills | Status: AC
Start: 2024-02-19 — End: ?
  Filled 2024-02-19: qty 3, 9d supply, fill #0

## 2024-02-19 NOTE — Progress Notes (Signed)
 Kristin Esparza 1976/11/05 [id number]   History:  48 y.o. G0 presents for annual exam. Monthly cycles on OCPs.  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 Normal mammogram history. Going on cruise next month and gets very seasick. Tried Dramamine time and it did not help.   Gynecologic History Patient's last menstrual period was 02/05/2024. Period Cycle (Days): 28 Period Duration (Days): 7 Menstrual Flow: Heavy Menstrual Control: Maxi pad, Tampon Dysmenorrhea: (!) Mild Contraception/Family planning: OCP (estrogen/progesterone) Sexually active: No  Health Maintenance Last Pap: 10/24/2020. Results were: Normal Last mammogram: 05/01/2023. 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  ROS:  A ROS was performed and pertinent positives and negatives are included.  Exam:  Vitals:   02/19/24 0804  BP: 126/72  Pulse: 86  SpO2: 98%  Weight: 152 lb (68.9 kg)  Height: 5' 4.75" (1.645 m)      Body mass index is 25.49 kg/m.  General appearance:  Normal Thyroid:  Symmetrical, normal in size, without palpable masses or nodularity. Respiratory  Auscultation:  Clear without wheezing or rhonchi Cardiovascular  Auscultation:  Regular rate, without rubs, murmurs or gallops  Edema/varicosities:  Not grossly evident Abdominal  Soft,nontender, without masses, guarding or rebound.  Liver/spleen:  No organomegaly noted  Hernia:  None appreciated  Skin  Inspection:  Grossly normal   Breasts: Examined lying and sitting.   Right: Without masses, retractions, discharge or axillary adenopathy.   Left: Without masses, retractions, discharge or axillary adenopathy. Pelvic: External genitalia:  no lesions              Urethra:  normal appearing urethra with no masses, tenderness or lesions              Bartholins  and Skenes: normal                 Vagina: normal appearing vagina with normal color and discharge, no lesions              Cervix: no lesions Bimanual Exam:  Uterus:  no masses or tenderness              Adnexa: no mass, fullness, tenderness              Rectovaginal: Deferred              Anus:  normal, no lesions  Patient informed chaperone available to be present for breast and pelvic exam. Patient has requested no chaperone to be present. Patient has been advised what will be completed during breast and pelvic exam.   Assessment/Plan:  48 y.o. G0 for annual exam.   Well woman exam with routine gynecological exam - Plan: CBC with Differential/Platelet, Comprehensive metabolic panel. Education provided on SBEs, importance of preventative screenings, current guidelines, high calcium diet, regular exercise, and multivitamin daily.   Encounter for surveillance of contraceptive pills - Plan: Norgestimate-Ethinyl Estradiol Triphasic (TRI-SPRINTEC) 0.18/0.215/0.25 MG-35 MCG tablet. Will switch back to Tri-Sprintec. Refill x 1 year provided.   Cervical cancer screening - Plan: Cytology - PAP( [place]). 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   Hyperlipidemia, unspecified hyperlipidemia type - Plan: Lipid panel  Screening for colon cancer - Plan: Ambulatory referral to Gastroenterology. Discussed current guidelines and importance of preventative screenings. Cologuard versus Colonoscopy discussed. Requesting colonoscopy referral.   Motion sickness, subsequent encounter - Plan: scopolamine (TRANSDERM-SCOP)  1 MG/3DAYS every 72 hours, ondansetron (ZOFRAN) 4 MG tablet BID as needed for nausea and/or vomiting.   Screening for breast cancer - Normal mammogram history. Continue annual screenings. Normal breast exam today.   Return in about 1 year (around 02/18/2025) for Annual.     [name] [facility], 8:17 AM 02/19/2024

## 2024-02-20 LAB — COMPREHENSIVE METABOLIC PANEL
AG Ratio: 1.6 (calc) (ref 1.0–2.5)
ALT: 8 U/L (ref 6–29)
AST: 14 U/L (ref 10–35)
Albumin: 4.2 g/dL (ref 3.6–5.1)
Alkaline phosphatase (APISO): 39 U/L (ref 31–125)
BUN: 21 mg/dL (ref 7–25)
CO2: 24 mmol/L (ref 20–32)
Calcium: 9.1 mg/dL (ref 8.6–10.2)
Chloride: 104 mmol/L (ref 98–110)
Creat: 0.73 mg/dL (ref 0.50–0.99)
Globulin: 2.6 g/dL (ref 1.9–3.7)
Glucose, Bld: 85 mg/dL (ref 65–99)
Potassium: 4 mmol/L (ref 3.5–5.3)
Sodium: 136 mmol/L (ref 135–146)
Total Bilirubin: 0.5 mg/dL (ref 0.2–1.2)
Total Protein: 6.8 g/dL (ref 6.1–8.1)

## 2024-02-20 LAB — LIPID PANEL
Cholesterol: 254 mg/dL — ABNORMAL HIGH (ref <200)
HDL: 102 mg/dL (ref >=50)
LDL Cholesterol (Calc): 128 mg/dL — ABNORMAL HIGH
Non-HDL Cholesterol (Calc): 152 mg/dL — ABNORMAL HIGH (ref <130)
Total CHOL/HDL Ratio: 2.5 (calc) (ref <5.0)
Triglycerides: 125 mg/dL (ref <150)

## 2024-02-20 LAB — CYTOLOGY - PAP
Comment: NEGATIVE
Diagnosis: NEGATIVE
High risk HPV: NEGATIVE

## 2024-02-20 LAB — CBC WITH DIFFERENTIAL/PLATELET
Absolute Lymphocytes: 1375 {cells}/uL (ref 850–3900)
Absolute Monocytes: 403 {cells}/uL (ref 200–950)
Basophils Absolute: 43 {cells}/uL (ref 0–200)
Basophils Relative: 0.6 %
Eosinophils Absolute: 173 {cells}/uL (ref 15–500)
Eosinophils Relative: 2.4 %
HCT: 40.1 % (ref 35.0–45.0)
Hemoglobin: 13.2 g/dL (ref 11.7–15.5)
MCH: 30.1 pg (ref 27.0–33.0)
MCHC: 32.9 g/dL (ref 32.0–36.0)
MCV: 91.3 fL (ref 80.0–100.0)
MPV: 10.8 fL (ref 7.5–12.5)
Monocytes Relative: 5.6 %
Neutro Abs: 5206 {cells}/uL (ref 1500–7800)
Neutrophils Relative %: 72.3 %
Platelets: 196 10*3/uL (ref 140–400)
RBC: 4.39 10*6/uL (ref 3.80–5.10)
RDW: 13.1 % (ref 11.0–15.0)
Total Lymphocyte: 19.1 %
WBC: 7.2 10*3/uL (ref 3.8–10.8)

## 2024-02-23 ENCOUNTER — Encounter: Payer: Self-pay | Admitting: Nurse Practitioner

## 2024-04-20 ENCOUNTER — Other Ambulatory Visit (HOSPITAL_COMMUNITY): Payer: Self-pay

## 2024-04-22 ENCOUNTER — Other Ambulatory Visit: Payer: Self-pay | Admitting: Nurse Practitioner

## 2024-04-22 DIAGNOSIS — Z1231 Encounter for screening mammogram for malignant neoplasm of breast: Secondary | ICD-10-CM

## 2024-05-07 ENCOUNTER — Ambulatory Visit

## 2024-05-10 ENCOUNTER — Encounter: Payer: Self-pay | Admitting: Nurse Practitioner

## 2024-05-20 ENCOUNTER — Ambulatory Visit
Admission: RE | Admit: 2024-05-20 | Discharge: 2024-05-20 | Disposition: A | Discharge status: Home / Self Care | Source: Ambulatory Visit | Attending: Nurse Practitioner | Admitting: Nurse Practitioner

## 2024-05-20 DIAGNOSIS — Z1231 Encounter for screening mammogram for malignant neoplasm of breast: Secondary | ICD-10-CM

## 2024-05-26 ENCOUNTER — Other Ambulatory Visit: Payer: Self-pay | Admitting: Nurse Practitioner

## 2024-05-26 DIAGNOSIS — R928 Other abnormal and inconclusive findings on diagnostic imaging of breast: Secondary | ICD-10-CM

## 2024-06-03 ENCOUNTER — Ambulatory Visit

## 2024-06-03 ENCOUNTER — Ambulatory Visit
Admission: RE | Admit: 2024-06-03 | Discharge: 2024-06-03 | Disposition: A | Discharge status: Home / Self Care | Source: Ambulatory Visit | Attending: Nurse Practitioner | Admitting: Nurse Practitioner

## 2024-06-03 DIAGNOSIS — R928 Other abnormal and inconclusive findings on diagnostic imaging of breast: Secondary | ICD-10-CM | POA: Diagnosis not present

## 2024-06-04 ENCOUNTER — Telehealth: Admitting: Family Medicine

## 2024-06-04 ENCOUNTER — Other Ambulatory Visit (HOSPITAL_COMMUNITY): Payer: Self-pay

## 2024-06-04 DIAGNOSIS — B9689 Other specified bacterial agents as the cause of diseases classified elsewhere: Secondary | ICD-10-CM

## 2024-06-04 DIAGNOSIS — J019 Acute sinusitis, unspecified: Secondary | ICD-10-CM | POA: Diagnosis not present

## 2024-06-04 MED ORDER — CLINDAMYCIN HCL 300 MG PO CAPS
300.0000 mg | ORAL_CAPSULE | Freq: Three times a day (TID) | ORAL | 0 refills | Status: AC
  Filled 2024-06-04: qty 30, 10d supply, fill #0

## 2024-06-04 NOTE — Progress Notes (Signed)
 E-Visit for Sinus Problems  We are sorry that you are not feeling well.  Here is how we plan to help!  Based on what you have shared with me it looks like you have sinusitis.  Sinusitis is inflammation and infection in the sinus cavities of the head.  Based on your presentation I believe you most likely have Acute Bacterial Sinusitis.  This is an infection caused by bacteria and is treated with antibiotics. I have prescribed clindamycin . You may use an oral decongestant such as Mucinex D or if you have glaucoma or high blood pressure use plain Mucinex. Saline nasal spray help and can safely be used as often as needed for congestion.  If you develop worsening sinus pain, fever or notice severe headache and vision changes, or if symptoms are not better after completion of antibiotic, please schedule an appointment with a health care provider.    Sinus infections are not as easily transmitted as other respiratory infection, however we still recommend that you avoid close contact with loved ones, especially the very young and elderly.  Remember to wash your hands thoroughly throughout the day as this is the number one way to prevent the spread of infection!  Home Care: Only take medications as instructed by your medical team. Complete the entire course of an antibiotic. Do not take these medications with alcohol. A steam or ultrasonic humidifier can help congestion.  You can place a towel over your head and breathe in the steam from hot water coming from a faucet. Avoid close contacts especially the very young and the elderly. Cover your mouth when you cough or sneeze. Always remember to wash your hands.  Get Help Right Away If: You develop worsening fever or sinus pain. You develop a severe head ache or visual changes. Your symptoms persist after you have completed your treatment plan.  Make sure you Understand these instructions. Will watch your condition. Will get help right away if you are  not doing well or get worse.  Thank you for choosing an e-visit.  Your e-visit answers were reviewed by a board certified advanced clinical practitioner to complete your personal care plan. Depending upon the condition, your plan could have included both over the counter or prescription medications.  Please review your pharmacy choice. Make sure the pharmacy is open so you can pick up prescription now. If there is a problem, you may contact your provider through [employer] and have the prescription routed to another pharmacy.  Your safety is important to us . If you have drug allergies check your prescription carefully.   For the next 24 hours you can use MyChart to ask questions about today's visit, request a non-urgent call back, or ask for a work or school excuse. You will get an email in the next two days asking about your experience. I hope that your e-visit has been valuable and will speed your recovery.    have provided 5 minutes of non face to face time during this encounter for chart review and documentation.

## 2024-06-10 ENCOUNTER — Encounter

## 2024-06-10 ENCOUNTER — Other Ambulatory Visit

## 2024-07-31 ENCOUNTER — Other Ambulatory Visit (HOSPITAL_COMMUNITY): Payer: Self-pay

## 2024-07-31 ENCOUNTER — Telehealth: Admitting: Nurse Practitioner

## 2024-07-31 DIAGNOSIS — J019 Acute sinusitis, unspecified: Secondary | ICD-10-CM

## 2024-07-31 DIAGNOSIS — B9689 Other specified bacterial agents as the cause of diseases classified elsewhere: Secondary | ICD-10-CM | POA: Diagnosis not present

## 2024-07-31 MED ORDER — DOXYCYCLINE MONOHYDRATE 100 MG PO CAPS
100.0000 mg | ORAL_CAPSULE | Freq: Two times a day (BID) | ORAL | 0 refills | Status: AC
Start: 2024-07-31 — End: 2024-08-05
  Filled 2024-07-31: qty 10, 5d supply, fill #0

## 2024-07-31 NOTE — Progress Notes (Signed)

## 2024-07-31 NOTE — Progress Notes (Signed)
 I have spent 5 minutes in review of e-visit questionnaire, review and updating patient chart, medical decision making and response to patient.   Claiborne Rigg, NP

## 2024-08-02 ENCOUNTER — Other Ambulatory Visit (HOSPITAL_COMMUNITY): Payer: Self-pay

## 2024-08-02 MED ORDER — LORAZEPAM 1 MG PO TABS
2.0000 mg | ORAL_TABLET | ORAL | 0 refills | Status: AC
  Filled 2024-08-02: qty 4, 1d supply, fill #0

## 2024-08-02 MED ORDER — AZITHROMYCIN 250 MG PO TABS
ORAL_TABLET | ORAL | 0 refills | Status: AC
  Filled 2024-08-02: qty 6, 5d supply, fill #0

## 2024-08-03 ENCOUNTER — Other Ambulatory Visit (HOSPITAL_COMMUNITY): Payer: Self-pay

## 2024-08-03 MED ORDER — HYDROCODONE-ACETAMINOPHEN 5-325 MG PO TABS
1.0000 | ORAL_TABLET | Freq: Four times a day (QID) | ORAL | 0 refills | Status: AC | PRN
  Filled 2024-08-03: qty 15, 4d supply, fill #0

## 2024-08-03 MED ORDER — CLINDAMYCIN HCL 300 MG PO CAPS
300.0000 mg | ORAL_CAPSULE | Freq: Three times a day (TID) | ORAL | 1 refills | Status: AC
  Filled 2024-08-03: qty 21, 7d supply, fill #0
  Filled 2024-08-05 – 2024-08-09 (×3): qty 21, 7d supply, fill #1
  Filled 2024-08-10: qty 21, 7d supply, fill #0

## 2024-08-05 ENCOUNTER — Other Ambulatory Visit (HOSPITAL_COMMUNITY): Payer: Self-pay

## 2024-08-10 ENCOUNTER — Other Ambulatory Visit (HOSPITAL_COMMUNITY): Payer: Self-pay

## 2025-03-10 ENCOUNTER — Ambulatory Visit: Admitting: Nurse Practitioner

## 2025-03-11 ENCOUNTER — Ambulatory Visit: Admitting: Nurse Practitioner
